# Patient Record
Sex: Male | Born: 1985
Health system: Southern US, Community
[De-identification: ages and names within clinical notes are randomized; demographics above are authoritative.]

## PROBLEM LIST (undated history)

## (undated) DIAGNOSIS — D869 Sarcoidosis, unspecified: Secondary | ICD-10-CM

## (undated) DIAGNOSIS — E119 Type 2 diabetes mellitus without complications: Secondary | ICD-10-CM

## (undated) DIAGNOSIS — G43909 Migraine, unspecified, not intractable, without status migrainosus: Secondary | ICD-10-CM

## (undated) DIAGNOSIS — I10 Essential (primary) hypertension: Secondary | ICD-10-CM

## (undated) DIAGNOSIS — J45909 Unspecified asthma, uncomplicated: Secondary | ICD-10-CM

## (undated) DIAGNOSIS — Z8619 Personal history of other infectious and parasitic diseases: Secondary | ICD-10-CM

## (undated) HISTORY — DX: Sarcoidosis, unspecified: D86.9

## (undated) HISTORY — DX: Migraine, unspecified, not intractable, without status migrainosus: G43.909

## (undated) HISTORY — DX: Personal history of other infectious and parasitic diseases: Z86.19

## (undated) HISTORY — DX: Essential (primary) hypertension: I10

## (undated) HISTORY — DX: Unspecified asthma, uncomplicated: J45.909

## (undated) HISTORY — PX: LEG SURGERY: SHX1003

---

## 2007-03-05 DIAGNOSIS — I10 Essential (primary) hypertension: Secondary | ICD-10-CM

## 2007-03-05 DIAGNOSIS — Y249XXA Unspecified firearm discharge, undetermined intent, initial encounter: Secondary | ICD-10-CM

## 2007-03-05 DIAGNOSIS — W3400XA Accidental discharge from unspecified firearms or gun, initial encounter: Secondary | ICD-10-CM

## 2007-03-05 HISTORY — DX: Essential (primary) hypertension: I10

## 2007-03-05 HISTORY — DX: Accidental discharge from unspecified firearms or gun, initial encounter: W34.00XA

## 2007-03-05 HISTORY — PX: OTHER SURGICAL HISTORY: SHX169

## 2007-03-05 HISTORY — DX: Unspecified firearm discharge, undetermined intent, initial encounter: Y24.9XXA

## 2010-04-19 ENCOUNTER — Emergency Department (HOSPITAL_COMMUNITY)
Admission: EM | Admit: 2010-04-19 | Discharge: 2010-04-19 | Disposition: A | Discharge status: Home / Self Care | Payer: Self-pay | Attending: Emergency Medicine | Admitting: Emergency Medicine

## 2010-04-19 DIAGNOSIS — R071 Chest pain on breathing: Secondary | ICD-10-CM | POA: Insufficient documentation

## 2010-05-10 ENCOUNTER — Emergency Department (HOSPITAL_COMMUNITY): Payer: Self-pay

## 2010-05-10 ENCOUNTER — Emergency Department (HOSPITAL_COMMUNITY)
Admission: EM | Admit: 2010-05-10 | Discharge: 2010-05-10 | Disposition: A | Discharge status: Home / Self Care | Payer: Self-pay | Attending: Emergency Medicine | Admitting: Emergency Medicine

## 2010-05-10 DIAGNOSIS — R071 Chest pain on breathing: Secondary | ICD-10-CM | POA: Insufficient documentation

## 2010-05-10 DIAGNOSIS — R110 Nausea: Secondary | ICD-10-CM | POA: Insufficient documentation

## 2010-05-10 DIAGNOSIS — R0989 Other specified symptoms and signs involving the circulatory and respiratory systems: Secondary | ICD-10-CM | POA: Insufficient documentation

## 2010-05-10 DIAGNOSIS — R61 Generalized hyperhidrosis: Secondary | ICD-10-CM | POA: Insufficient documentation

## 2010-05-10 DIAGNOSIS — R42 Dizziness and giddiness: Secondary | ICD-10-CM | POA: Insufficient documentation

## 2010-05-10 DIAGNOSIS — I509 Heart failure, unspecified: Secondary | ICD-10-CM | POA: Insufficient documentation

## 2010-05-10 DIAGNOSIS — R0609 Other forms of dyspnea: Secondary | ICD-10-CM | POA: Insufficient documentation

## 2010-05-10 DIAGNOSIS — R0602 Shortness of breath: Secondary | ICD-10-CM | POA: Insufficient documentation

## 2010-05-10 DIAGNOSIS — R55 Syncope and collapse: Secondary | ICD-10-CM | POA: Insufficient documentation

## 2010-05-10 DIAGNOSIS — R51 Headache: Secondary | ICD-10-CM | POA: Insufficient documentation

## 2010-05-10 DIAGNOSIS — I10 Essential (primary) hypertension: Secondary | ICD-10-CM | POA: Insufficient documentation

## 2010-05-10 LAB — RAPID URINE DRUG SCREEN, HOSP PERFORMED
Barbiturates: NOT DETECTED
Cocaine: NOT DETECTED
Opiates: NOT DETECTED
Tetrahydrocannabinol: NOT DETECTED

## 2010-05-10 LAB — DIFFERENTIAL
Eosinophils Relative: 4 % (ref 0–5)
Lymphocytes Relative: 40 % (ref 12–46)
Lymphs Abs: 2.9 10*3/uL (ref 0.7–4.0)
Monocytes Absolute: 0.7 10*3/uL (ref 0.1–1.0)

## 2010-05-10 LAB — BASIC METABOLIC PANEL
GFR calc non Af Amer: >60 mL/min (ref >60)
Glucose, Bld: 97 mg/dL (ref 70–99)
Potassium: 3.5 mEq/L (ref 3.5–5.1)
Sodium: 136 mEq/L (ref 135–145)

## 2010-05-10 LAB — CBC
HCT: 46.3 % (ref 39.0–52.0)
MCHC: 35.9 g/dL (ref 30.0–36.0)
MCV: 82.4 fL (ref 78.0–100.0)
RDW: 12.7 % (ref 11.5–15.5)
WBC: 7.2 10*3/uL (ref 4.0–10.5)

## 2010-05-10 LAB — BRAIN NATRIURETIC PEPTIDE: Pro B Natriuretic peptide (BNP): <30 pg/mL (ref 0.0–100.0)

## 2010-05-10 LAB — POCT CARDIAC MARKERS
CKMB, poc: <1 ng/mL — ABNORMAL LOW (ref 1.0–8.0)
Troponin i, poc: <0.05 ng/mL (ref 0.00–0.09)

## 2010-05-10 MED ORDER — IOHEXOL 300 MG/ML  SOLN
100.0000 mL | Freq: Once | INTRAMUSCULAR | Status: AC | PRN
  Administered 2010-05-10: 100 mL via INTRAVENOUS

## 2010-10-22 ENCOUNTER — Ambulatory Visit (INDEPENDENT_AMBULATORY_CARE_PROVIDER_SITE_OTHER): Payer: BC Managed Care – PPO | Admitting: Internal Medicine

## 2010-10-22 ENCOUNTER — Encounter: Payer: Self-pay | Admitting: *Deleted

## 2010-10-22 ENCOUNTER — Encounter: Payer: Self-pay | Admitting: Internal Medicine

## 2010-10-22 VITALS — BP 132/98 | HR 61 | Temp 98.4°F | Resp 14 | Ht 71.75 in | Wt 280.1 lb

## 2010-10-22 DIAGNOSIS — I10 Essential (primary) hypertension: Secondary | ICD-10-CM

## 2010-10-22 LAB — TSH: TSH: 1.79 u[IU]/mL (ref 0.35–5.50)

## 2010-10-22 LAB — BASIC METABOLIC PANEL
CO2: 30 mEq/L (ref 19–32)
GFR: 90.48 mL/min (ref >60.00)
Glucose, Bld: 122 mg/dL — ABNORMAL HIGH (ref 70–99)
Potassium: 4.9 mEq/L (ref 3.5–5.1)
Sodium: 140 mEq/L (ref 135–145)

## 2010-10-22 MED ORDER — METOPROLOL TARTRATE 25 MG PO TABS: 25.0000 mg | ORAL_TABLET | Freq: Two times a day (BID) | ORAL | Status: DC

## 2010-10-22 NOTE — Patient Instructions (Signed)
Check of blood pressure daily and particularly when you're not feeling well. Bring a log  when you come back Using a large cuff

## 2010-10-22 NOTE — Assessment & Plan Note (Addendum)
Patient reports elevated blood pressure for the last 3 years. Also having episodes of feeling slightly nauseous, dizzy, flushed and headaches. He went to the ER 5 months ago with chest pain shortness of breath, workup was negative, see history of present illness. BP today within normal, I also checked his BP in both arms and BPs are symmetric. Plan: Trial with low-dose of beta blocker since BP elevated in the ambulatory setting   recommend to continue with ambulatory blood pressure checks with a large cuff. If no better, consider rule out pheochromocytoma Repeat basic labs and a TSH.
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
## 2010-10-30 ENCOUNTER — Telehealth: Payer: Self-pay | Admitting: *Deleted

## 2010-10-30 NOTE — Telephone Encounter (Signed)
Patient came in to office. Informed Pt that OV would be necessary for assessment. Scheduled at 11:00am 10/31/10

## 2010-10-30 NOTE — Telephone Encounter (Signed)
Pt calling says that metoprolol helps w/ bp but did have to leave work today due to medication causing HA along w/ nausea makes him sick to his stomach. Tried to tough it out by symptoms aren't resolving and would like for medication to be change also will need to have work note since he left work this morning due to side effects.

## 2010-10-31 ENCOUNTER — Ambulatory Visit: Payer: Self-pay | Admitting: Internal Medicine

## 2010-10-31 ENCOUNTER — Encounter: Payer: Self-pay | Admitting: Internal Medicine

## 2010-10-31 ENCOUNTER — Ambulatory Visit (INDEPENDENT_AMBULATORY_CARE_PROVIDER_SITE_OTHER): Payer: BC Managed Care – PPO | Admitting: Internal Medicine

## 2010-10-31 ENCOUNTER — Encounter: Payer: Self-pay | Admitting: *Deleted

## 2010-10-31 VITALS — BP 108/72 | HR 62 | Temp 98.5°F | Resp 14 | Wt 282.2 lb

## 2010-10-31 DIAGNOSIS — I10 Essential (primary) hypertension: Secondary | ICD-10-CM

## 2010-10-31 MED ORDER — CARVEDILOL 3.125 MG PO TABS: 3.1250 mg | ORAL_TABLET | Freq: Two times a day (BID) | ORAL | Status: DC

## 2010-10-31 NOTE — Patient Instructions (Signed)
Check the  blood pressure daily , goal is between 110/60 to 140/85. If it is consistently higher or lower , let me know Start carvedilol twice day Take 2  Tablets twice a day if your BP not at goal Call with BP readings in 10 days, call if side effects
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
## 2010-10-31 NOTE — Assessment & Plan Note (Addendum)
Started metoprolol 25 mg bid last week, having  "side effects" -------> I wonder if nausea/dizziness are actually over controlled BP since his BP today is low  despite stopping BB 36 hours ago Plan: Trial w/ low dose coreg , titrate as needed, See instructions.

## 2010-11-01 ENCOUNTER — Ambulatory Visit: Payer: Self-pay | Admitting: Internal Medicine

## 2010-11-02 ENCOUNTER — Ambulatory Visit: Payer: Self-pay | Admitting: Internal Medicine

## 2010-12-03 ENCOUNTER — Ambulatory Visit: Payer: BC Managed Care – PPO | Admitting: Internal Medicine

## 2010-12-03 DIAGNOSIS — Z0289 Encounter for other administrative examinations: Secondary | ICD-10-CM

## 2012-01-17 ENCOUNTER — Encounter (HOSPITAL_COMMUNITY): Payer: Self-pay

## 2012-01-17 ENCOUNTER — Emergency Department (INDEPENDENT_AMBULATORY_CARE_PROVIDER_SITE_OTHER)
Admission: EM | Admit: 2012-01-17 | Discharge: 2012-01-17 | Disposition: A | Discharge status: Home / Self Care | Payer: Self-pay | Source: Home / Self Care | Attending: Emergency Medicine | Admitting: Emergency Medicine

## 2012-01-17 DIAGNOSIS — B958 Unspecified staphylococcus as the cause of diseases classified elsewhere: Secondary | ICD-10-CM

## 2012-01-17 DIAGNOSIS — R21 Rash and other nonspecific skin eruption: Secondary | ICD-10-CM

## 2012-01-17 DIAGNOSIS — L089 Local infection of the skin and subcutaneous tissue, unspecified: Secondary | ICD-10-CM

## 2012-01-17 MED ORDER — OMEPRAZOLE 20 MG PO CPDR: 20.0000 mg | DELAYED_RELEASE_CAPSULE | Freq: Every day | ORAL | Status: DC

## 2012-01-17 MED ORDER — DOXYCYCLINE HYCLATE 100 MG PO CAPS: 100.0000 mg | ORAL_CAPSULE | Freq: Two times a day (BID) | ORAL | Status: DC

## 2012-01-17 MED ORDER — ACETAMINOPHEN ER 650 MG PO TBCR: 650.0000 mg | EXTENDED_RELEASE_TABLET | Freq: Three times a day (TID) | ORAL | Status: DC | PRN

## 2012-01-17 MED ORDER — CHLORHEXIDINE GLUCONATE 4 % EX LIQD: 60.0000 mL | Freq: Every day | CUTANEOUS | Status: DC | PRN

## 2012-01-17 NOTE — ED Notes (Signed)
Patient c/o rash/insect bites ?? On torso and both legs, sx started 2 days ago, also complains of a cough that he has had for some time and states that it is worse after eating, has been feeling weak, fatigued and dizzy
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
## 2012-01-18 NOTE — ED Provider Notes (Signed)
Medical screening examination/treatment/procedure(s) were performed by non-physician practitioner and as supervising physician I was immediately available for consultation/collaboration.  Leslee Home, M.D.   Reuben Likes, MD 01/18/12 2214

## 2012-01-23 ENCOUNTER — Emergency Department (INDEPENDENT_AMBULATORY_CARE_PROVIDER_SITE_OTHER)
Admission: EM | Admit: 2012-01-23 | Discharge: 2012-01-23 | Disposition: A | Discharge status: Home / Self Care | Payer: Self-pay | Source: Home / Self Care | Attending: Emergency Medicine | Admitting: Emergency Medicine

## 2012-01-23 ENCOUNTER — Encounter (HOSPITAL_COMMUNITY): Payer: Self-pay | Admitting: Emergency Medicine

## 2012-01-23 DIAGNOSIS — L739 Follicular disorder, unspecified: Secondary | ICD-10-CM

## 2012-01-23 DIAGNOSIS — L738 Other specified follicular disorders: Secondary | ICD-10-CM

## 2012-01-23 MED ORDER — SULFAMETHOXAZOLE-TMP DS 800-160 MG PO TABS: 2.0000 | ORAL_TABLET | Freq: Two times a day (BID) | ORAL | Status: DC

## 2012-01-23 MED ORDER — MUPIROCIN 2 % EX OINT: TOPICAL_OINTMENT | Freq: Three times a day (TID) | CUTANEOUS | Status: DC

## 2012-01-23 NOTE — ED Notes (Signed)
Bumps/blister for one week.  The first location was the left side, then left thigh, then right thigh, now arms.  np on Friday told patient this was a skin infection and instructed to use hibiclens, doxycycline.  patientwas seen and started these meds last Friday 1/15.  Patient feels rash is getting worse, but particularly concerned about feeling drained, weak and tired.  Patient is not seeing any improvement
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
## 2012-01-24 NOTE — ED Notes (Signed)
Pt called stating that he wanted to see if his lab results were back.  Verified DOB and address.  Culture is still preliminary and I explained this to the pt.  Instructed the pt to call back on Monday and the final result should be back.  Then another voice from same line started talking and it was his mother.  Mother asked what meds he was on and what we diagnosed him with and wanted to know the preliminary results b/c "I am a nurse".  Explained to her that I couldn't tell her b/c she wasn't the pt.  Pt and mother on on same line and 2 different phones now and pt verbalized that it was ok to talk with her on the phone.  Told mother that I couldn't give them the preliminary results b/c if something changed and that we would give final result once they were back, also that he was placed Bactrim.  Mother wanted to know why we treated it with Bactrim if we didn't know if he had MRSA or not.  Explained to mother and pt that Dr. Lorenz Coaster treated him based on exam and that once the final culture results came back we would make sure that the infection was resistant to Bactrim and if not treat appropriately.  Mother then wanted to know where he got this from.  Explained to her that folliculitis is an infected hair follicle and that I can't determine where he got that from.  Going to call back later this weekend for results.

## 2012-01-25 ENCOUNTER — Telehealth (HOSPITAL_COMMUNITY): Payer: Self-pay | Admitting: Emergency Medicine

## 2012-01-25 NOTE — ED Notes (Signed)
I called patient back to verify that he was no longer taking doxycyline.  He stated he stopped it when he was here on 01/23/2012 per Dr Melanee Spry orders.

## 2012-01-25 NOTE — ED Notes (Signed)
Patient called requesting lab results.  I made patient aware that results were probably not final but would take a look.  I informed that the preliminary report showed MRSA but did not show sensitivity.  I explained culture process to patient to help him understand the length of time it took to give him complete and accurate results.  I told patient, just as he was informed yesterday, that our lab nurse would call him Monday with results and notify him if he was adequately treated.  I also explained to patient that if he was not we would call in an appropriate medication for him.  Patient concern about life threating complications from MRSA.  I explained to patient that MRSA can be life threatening for immune compromised patients but he did not meet that criteria.  I went over the importance of taking his antibiotics as instructed and finishing them completely unless we called and told him otherwise.  Patient expressed understanding and did not have any other questions or concerns.

## 2012-01-26 LAB — CULTURE, ROUTINE-ABSCESS: Special Requests: NORMAL

## 2012-01-27 ENCOUNTER — Telehealth (HOSPITAL_COMMUNITY): Payer: Self-pay | Admitting: *Deleted

## 2012-01-27 NOTE — ED Notes (Signed)
Pt. called in for his lab results.  Abscess culture R arm: Staph. Aureus.  Pt. treated with Doxycycline-resistant. He said he was given another Rx. But told not to start it until he got the results. It looks like pt. Got Bactrim DS on 11/21 and that is sensitive.   I told pt. To stop the Doxycycline and finish all of the Bactrim DS. Pt. voiced understanding. Vassie Moselle 01/27/2012

## 2012-02-05 NOTE — ED Notes (Signed)
Patient called asking for a note for his job to cover him being out of work from day of first visit (11-15) until return date (11-21) Discussed note duration with Dr Lorenz Coaster, who is authorizing note to cover period of 11/21-11/22 . Patient was advised this is the note offered for his absent days . We are unable to cover the other days , as this was a decision made by the patient w/o discussion w a representative of the [facility] clinic

## 2012-04-13 ENCOUNTER — Encounter (HOSPITAL_COMMUNITY): Payer: Self-pay | Admitting: Neurology

## 2012-04-13 ENCOUNTER — Emergency Department (HOSPITAL_COMMUNITY)
Admission: EM | Admit: 2012-04-13 | Discharge: 2012-04-13 | Disposition: A | Discharge status: Home / Self Care | Payer: Self-pay | Attending: Emergency Medicine | Admitting: Emergency Medicine

## 2012-04-13 ENCOUNTER — Emergency Department (HOSPITAL_COMMUNITY): Payer: Self-pay

## 2012-04-13 DIAGNOSIS — R053 Chronic cough: Secondary | ICD-10-CM

## 2012-04-13 DIAGNOSIS — R05 Cough: Secondary | ICD-10-CM

## 2012-04-13 DIAGNOSIS — I10 Essential (primary) hypertension: Secondary | ICD-10-CM | POA: Insufficient documentation

## 2012-04-13 DIAGNOSIS — R358 Other polyuria: Secondary | ICD-10-CM | POA: Insufficient documentation

## 2012-04-13 DIAGNOSIS — R3589 Other polyuria: Secondary | ICD-10-CM | POA: Insufficient documentation

## 2012-04-13 DIAGNOSIS — R0602 Shortness of breath: Secondary | ICD-10-CM | POA: Insufficient documentation

## 2012-04-13 DIAGNOSIS — R059 Cough, unspecified: Secondary | ICD-10-CM | POA: Insufficient documentation

## 2012-04-13 LAB — POCT I-STAT, CHEM 8
BUN: 15 mg/dL (ref 6–23)
Calcium, Ion: 1.2 mmol/L (ref 1.12–1.23)
Chloride: 102 mEq/L (ref 96–112)
Creatinine, Ser: 1.1 mg/dL (ref 0.50–1.35)
Glucose, Bld: 229 mg/dL — ABNORMAL HIGH (ref 70–99)

## 2012-04-13 NOTE — ED Notes (Addendum)
Pt states sob all day x 5 years worse at night and after eating. States coughing all day without relief. Recently moved to area no PCP established. States polyuria, lips and mouth constantly dry. Family hx of CHF. Respiration even, unlabored. A x 4, NAD
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
## 2012-05-19 ENCOUNTER — Emergency Department (HOSPITAL_COMMUNITY): Payer: Self-pay

## 2012-05-19 ENCOUNTER — Encounter (HOSPITAL_COMMUNITY): Payer: Self-pay | Admitting: Cardiology

## 2012-05-19 ENCOUNTER — Emergency Department (HOSPITAL_COMMUNITY)
Admission: EM | Admit: 2012-05-19 | Discharge: 2012-05-20 | Disposition: A | Discharge status: Home / Self Care | Payer: Self-pay | Attending: Emergency Medicine | Admitting: Emergency Medicine

## 2012-05-19 DIAGNOSIS — R110 Nausea: Secondary | ICD-10-CM | POA: Insufficient documentation

## 2012-05-19 DIAGNOSIS — I10 Essential (primary) hypertension: Secondary | ICD-10-CM | POA: Insufficient documentation

## 2012-05-19 DIAGNOSIS — R109 Unspecified abdominal pain: Secondary | ICD-10-CM | POA: Insufficient documentation

## 2012-05-19 DIAGNOSIS — E119 Type 2 diabetes mellitus without complications: Secondary | ICD-10-CM

## 2012-05-19 DIAGNOSIS — R350 Frequency of micturition: Secondary | ICD-10-CM | POA: Insufficient documentation

## 2012-05-19 DIAGNOSIS — R300 Dysuria: Secondary | ICD-10-CM | POA: Insufficient documentation

## 2012-05-19 DIAGNOSIS — E1169 Type 2 diabetes mellitus with other specified complication: Secondary | ICD-10-CM | POA: Insufficient documentation

## 2012-05-19 DIAGNOSIS — R739 Hyperglycemia, unspecified: Secondary | ICD-10-CM

## 2012-05-19 LAB — GLUCOSE, CAPILLARY: Glucose-Capillary: 467 mg/dL — ABNORMAL HIGH (ref 70–99)

## 2012-05-19 LAB — CBC WITH DIFFERENTIAL/PLATELET
Basophils Absolute: 0 10*3/uL (ref 0.0–0.1)
Eosinophils Relative: 3 % (ref 0–5)
HCT: 43.1 % (ref 39.0–52.0)
Hemoglobin: 15.8 g/dL (ref 13.0–17.0)
Lymphocytes Relative: 36 % (ref 12–46)
MCHC: 36.7 g/dL — ABNORMAL HIGH (ref 30.0–36.0)
MCV: 78.4 fL (ref 78.0–100.0)
Monocytes Absolute: 0.5 10*3/uL (ref 0.1–1.0)
Monocytes Relative: 6 % (ref 3–12)
Neutro Abs: 4.2 10*3/uL (ref 1.7–7.7)
RDW: 12.3 % (ref 11.5–15.5)
WBC: 7.6 10*3/uL (ref 4.0–10.5)

## 2012-05-19 LAB — URINALYSIS, ROUTINE W REFLEX MICROSCOPIC
Leukocytes, UA: NEGATIVE
Protein, ur: NEGATIVE mg/dL
Urobilinogen, UA: 0.2 mg/dL (ref 0.0–1.0)

## 2012-05-19 LAB — BASIC METABOLIC PANEL
BUN: 12 mg/dL (ref 6–23)
CO2: 26 mEq/L (ref 19–32)
Calcium: 9.4 mg/dL (ref 8.4–10.5)
Chloride: 91 mEq/L — ABNORMAL LOW (ref 96–112)
Creatinine, Ser: 1.26 mg/dL (ref 0.50–1.35)

## 2012-05-19 MED ORDER — INSULIN REGULAR HUMAN 100 UNIT/ML IJ SOLN: 6.0000 [IU] | Freq: Once | INTRAMUSCULAR | Status: DC

## 2012-05-19 MED ORDER — SODIUM CHLORIDE 0.9 % IV BOLUS (SEPSIS)
1000.0000 mL | Freq: Once | INTRAVENOUS | Status: AC
  Administered 2012-05-19: 1000 mL via INTRAVENOUS

## 2012-05-19 MED ORDER — INSULIN ASPART 100 UNIT/ML ~~LOC~~ SOLN
6.0000 [IU] | Freq: Once | SUBCUTANEOUS | Status: AC
  Administered 2012-05-20: 6 [IU] via INTRAVENOUS
  Filled 2012-05-19: qty 1

## 2012-05-19 MED ORDER — METFORMIN HCL 500 MG PO TABS
500.0000 mg | ORAL_TABLET | Freq: Once | ORAL | Status: AC
  Administered 2012-05-20: 500 mg via ORAL
  Filled 2012-05-19: qty 1

## 2012-05-19 MED ORDER — SODIUM CHLORIDE 0.9 % IV BOLUS (SEPSIS)
1000.0000 mL | Freq: Once | INTRAVENOUS | Status: AC
  Administered 2012-05-20: 1000 mL via INTRAVENOUS

## 2012-05-19 MED ORDER — INSULIN ASPART 100 UNIT/ML ~~LOC~~ SOLN
6.0000 [IU] | Freq: Once | SUBCUTANEOUS | Status: AC
  Administered 2012-05-19: 6 [IU] via INTRAVENOUS

## 2012-05-19 NOTE — ED Notes (Signed)
Second bolus of IVFs running.
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
## 2012-05-19 NOTE — ED Notes (Addendum)
Pt reports pain to the left foot. States he dropped a cinder block on his foot a couple of days. States he also has been urinating a lot and increased thirst. Also reports intermittent nausea at times. Also c/o bilateral flank pain.

## 2012-05-19 NOTE — ED Notes (Signed)
Xray contacted to bring pt to pod a room 1 on return to ED

## 2012-05-20 LAB — GLUCOSE, CAPILLARY
Glucose-Capillary: 189 mg/dL — ABNORMAL HIGH (ref 70–99)
Glucose-Capillary: 317 mg/dL — ABNORMAL HIGH (ref 70–99)

## 2012-05-20 MED ORDER — METFORMIN HCL 500 MG PO TABS: 500.0000 mg | ORAL_TABLET | Freq: Two times a day (BID) | ORAL | Status: DC

## 2012-05-20 MED ORDER — INSULIN ASPART 100 UNIT/ML ~~LOC~~ SOLN
12.0000 [IU] | Freq: Once | SUBCUTANEOUS | Status: AC
  Administered 2012-05-20: 12 [IU] via INTRAVENOUS

## 2012-05-20 NOTE — ED Notes (Signed)
Pt given discharge paperwork; pt verbalized understanding of f/u and d/c; all questions answered that pt had; pt education given regarding pt's diagnosis of diabetes; VSS; resps e/u; e-signature obtained; ambulatory on discharge;

## 2012-05-20 NOTE — ED Provider Notes (Signed)
Medical screening examination/treatment/procedure(s) were conducted as a shared visit with non-physician practitioner(s) and myself.  I personally evaluated the patient during the encounter. Patient with new-onset diabetes. No infection. Patient was started on metformin after lowering his glucose here. He'll followup with primary care Dr. He states he will see Dr. Clemmie Krill R. Rubin Payor, MD 05/20/12 [phone]

## 2012-05-20 NOTE — ED Provider Notes (Signed)
Care assumed from prior team.  Pt with newly diagnosed diabetes, was receiving iv fluids and insulin.  Has PCM.  To be d/c on metformin.  BS now below 200.  Will d/c home  Olivia Mackie, MD 05/20/12 [phone]

## 2012-12-13 ENCOUNTER — Emergency Department (HOSPITAL_COMMUNITY)
Admission: EM | Admit: 2012-12-13 | Discharge: 2012-12-13 | Disposition: A | Discharge status: Home / Self Care | Payer: No Typology Code available for payment source | Attending: Emergency Medicine | Admitting: Emergency Medicine

## 2012-12-13 ENCOUNTER — Emergency Department (HOSPITAL_COMMUNITY): Payer: No Typology Code available for payment source

## 2012-12-13 ENCOUNTER — Encounter (HOSPITAL_COMMUNITY): Payer: Self-pay | Admitting: Emergency Medicine

## 2012-12-13 DIAGNOSIS — Y9389 Activity, other specified: Secondary | ICD-10-CM | POA: Insufficient documentation

## 2012-12-13 DIAGNOSIS — E119 Type 2 diabetes mellitus without complications: Secondary | ICD-10-CM | POA: Insufficient documentation

## 2012-12-13 DIAGNOSIS — Z79899 Other long term (current) drug therapy: Secondary | ICD-10-CM | POA: Insufficient documentation

## 2012-12-13 DIAGNOSIS — I10 Essential (primary) hypertension: Secondary | ICD-10-CM | POA: Insufficient documentation

## 2012-12-13 DIAGNOSIS — S8010XA Contusion of unspecified lower leg, initial encounter: Secondary | ICD-10-CM | POA: Insufficient documentation

## 2012-12-13 DIAGNOSIS — T148XXA Other injury of unspecified body region, initial encounter: Secondary | ICD-10-CM

## 2012-12-13 DIAGNOSIS — Y92410 Unspecified street and highway as the place of occurrence of the external cause: Secondary | ICD-10-CM | POA: Insufficient documentation

## 2012-12-13 HISTORY — DX: Type 2 diabetes mellitus without complications: E11.9

## 2012-12-13 LAB — CBC WITH DIFFERENTIAL/PLATELET
Basophils Relative: 0 % (ref 0–1)
Hemoglobin: 14.7 g/dL (ref 13.0–17.0)
Lymphs Abs: 3 10*3/uL (ref 0.7–4.0)
MCHC: 35.3 g/dL (ref 30.0–36.0)
Monocytes Relative: 7 % (ref 3–12)
Neutro Abs: 3.6 10*3/uL (ref 1.7–7.7)
Neutrophils Relative %: 49 % (ref 43–77)
Platelets: 375 10*3/uL (ref 150–400)
RBC: 4.99 MIL/uL (ref 4.22–5.81)

## 2012-12-13 LAB — BASIC METABOLIC PANEL
BUN: 20 mg/dL (ref 6–23)
Chloride: 103 mEq/L (ref 96–112)
GFR calc Af Amer: >90 mL/min (ref >90)
Potassium: 4.2 mEq/L (ref 3.5–5.1)
Sodium: 139 mEq/L (ref 135–145)

## 2012-12-13 MED ORDER — FENTANYL CITRATE 0.05 MG/ML IJ SOLN
50.0000 ug | Freq: Once | INTRAMUSCULAR | Status: AC
  Administered 2012-12-13: 50 ug via INTRAVENOUS
  Filled 2012-12-13: qty 2

## 2012-12-13 MED ORDER — IBUPROFEN 600 MG PO TABS: 600.0000 mg | ORAL_TABLET | Freq: Four times a day (QID) | ORAL | Status: DC | PRN

## 2012-12-13 MED ORDER — HYDROCODONE-ACETAMINOPHEN 5-325 MG PO TABS: 1.0000 | ORAL_TABLET | Freq: Four times a day (QID) | ORAL | Status: DC | PRN

## 2012-12-13 MED ORDER — SODIUM CHLORIDE 0.9 % IV SOLN
Freq: Once | INTRAVENOUS | Status: AC
  Administered 2012-12-13: 04:00:00 via INTRAVENOUS

## 2012-12-13 MED ORDER — KETOROLAC TROMETHAMINE 30 MG/ML IJ SOLN
30.0000 mg | Freq: Once | INTRAMUSCULAR | Status: AC
  Administered 2012-12-13: 30 mg via INTRAVENOUS
  Filled 2012-12-13: qty 1

## 2012-12-13 MED ORDER — PROMETHAZINE HCL 25 MG PO TABS: 25.0000 mg | ORAL_TABLET | Freq: Four times a day (QID) | ORAL | Status: DC | PRN

## 2012-12-13 MED ORDER — ONDANSETRON HCL 4 MG/2ML IJ SOLN
4.0000 mg | Freq: Once | INTRAMUSCULAR | Status: AC
  Administered 2012-12-13: 4 mg via INTRAVENOUS
  Filled 2012-12-13: qty 2
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
## 2012-12-13 NOTE — Progress Notes (Signed)
Orthopedic Tech Progress Note Patient Details:  Nicholas [name] 1985/06/17 [id number]  Ortho Devices Type of Ortho Device: Knee Immobilizer;Crutches   Haskell Flirt 12/13/2012, 6:56 AM

## 2012-12-13 NOTE — ED Notes (Signed)
Pt given d/c instructions and verbalized understanding. NAD at this time. VS are stable.  
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
## 2013-11-29 ENCOUNTER — Encounter (HOSPITAL_COMMUNITY): Payer: Self-pay | Admitting: Emergency Medicine

## 2013-11-29 ENCOUNTER — Emergency Department (INDEPENDENT_AMBULATORY_CARE_PROVIDER_SITE_OTHER)
Admission: EM | Admit: 2013-11-29 | Discharge: 2013-11-29 | Disposition: A | Discharge status: Nursing Home (Includes ICF) | Payer: Self-pay | Source: Home / Self Care

## 2013-11-29 DIAGNOSIS — E119 Type 2 diabetes mellitus without complications: Secondary | ICD-10-CM

## 2013-11-29 DIAGNOSIS — R5381 Other malaise: Secondary | ICD-10-CM

## 2013-11-29 DIAGNOSIS — E1165 Type 2 diabetes mellitus with hyperglycemia: Secondary | ICD-10-CM

## 2013-11-29 DIAGNOSIS — R7309 Other abnormal glucose: Secondary | ICD-10-CM

## 2013-11-29 DIAGNOSIS — I10 Essential (primary) hypertension: Secondary | ICD-10-CM | POA: Insufficient documentation

## 2013-11-29 DIAGNOSIS — E860 Dehydration: Secondary | ICD-10-CM | POA: Insufficient documentation

## 2013-11-29 DIAGNOSIS — R5383 Other fatigue: Secondary | ICD-10-CM

## 2013-11-29 DIAGNOSIS — R739 Hyperglycemia, unspecified: Secondary | ICD-10-CM

## 2013-11-29 LAB — COMPREHENSIVE METABOLIC PANEL
ALBUMIN: 4.1 g/dL (ref 3.5–5.2)
ALK PHOS: 74 U/L (ref 39–117)
ALT: 20 U/L (ref 0–53)
ANION GAP: 16 — AB (ref 5–15)
AST: 24 U/L (ref 0–37)
BUN: 16 mg/dL (ref 6–23)
CO2: 23 mEq/L (ref 19–32)
CREATININE: 1.01 mg/dL (ref 0.50–1.35)
Calcium: 9.6 mg/dL (ref 8.4–10.5)
Chloride: 96 mEq/L (ref 96–112)
GFR calc Af Amer: >90 mL/min (ref >90)
GFR calc non Af Amer: >90 mL/min (ref >90)
Glucose, Bld: 316 mg/dL — ABNORMAL HIGH (ref 70–99)
POTASSIUM: 4.4 meq/L (ref 3.7–5.3)
Sodium: 135 mEq/L — ABNORMAL LOW (ref 137–147)
Total Bilirubin: 0.3 mg/dL (ref 0.3–1.2)
Total Protein: 7.9 g/dL (ref 6.0–8.3)

## 2013-11-29 LAB — CBC WITH DIFFERENTIAL/PLATELET
BASOS PCT: 0 % (ref 0–1)
Basophils Absolute: 0 10*3/uL (ref 0.0–0.1)
EOS ABS: 0.1 10*3/uL (ref 0.0–0.7)
Eosinophils Relative: 1 % (ref 0–5)
HCT: 46.4 % (ref 39.0–52.0)
HEMOGLOBIN: 16.3 g/dL (ref 13.0–17.0)
Lymphocytes Relative: 44 % (ref 12–46)
Lymphs Abs: 3.4 10*3/uL (ref 0.7–4.0)
MCH: 28.3 pg (ref 26.0–34.0)
MCHC: 35.1 g/dL (ref 30.0–36.0)
MCV: 80.6 fL (ref 78.0–100.0)
Monocytes Absolute: 0.4 10*3/uL (ref 0.1–1.0)
Monocytes Relative: 5 % (ref 3–12)
NEUTROS PCT: 50 % (ref 43–77)
Neutro Abs: 3.9 10*3/uL (ref 1.7–7.7)
Platelets: 392 10*3/uL (ref 150–400)
RBC: 5.76 MIL/uL (ref 4.22–5.81)
RDW: 12.5 % (ref 11.5–15.5)
WBC: 7.8 10*3/uL (ref 4.0–10.5)

## 2013-11-29 LAB — POCT I-STAT, CHEM 8
BUN: 17 mg/dL (ref 6–23)
CALCIUM ION: 1.19 mmol/L (ref 1.12–1.23)
Chloride: 100 mEq/L (ref 96–112)
Creatinine, Ser: 1.1 mg/dL (ref 0.50–1.35)
Glucose, Bld: 344 mg/dL — ABNORMAL HIGH (ref 70–99)
HEMATOCRIT: 54 % — AB (ref 39.0–52.0)
Hemoglobin: 18.4 g/dL — ABNORMAL HIGH (ref 13.0–17.0)
Potassium: 4 mEq/L (ref 3.7–5.3)
Sodium: 135 mEq/L — ABNORMAL LOW (ref 137–147)
TCO2: 25 mmol/L (ref 0–100)

## 2013-11-29 LAB — CBG MONITORING, ED: GLUCOSE-CAPILLARY: 303 mg/dL — AB (ref 70–99)

## 2013-11-29 NOTE — Discharge Instructions (Signed)
Hyperglycemia Go to ED [facility] now Hyperglycemia occurs when the glucose (sugar) in your blood is too high. Hyperglycemia can happen for many reasons, but it most often happens to people who do not know they have diabetes or are not managing their diabetes properly.  CAUSES  Whether you have diabetes or not, there are other causes of hyperglycemia. Hyperglycemia can occur when you have diabetes, but it can also occur in other situations that you might not be as aware of, such as: Diabetes  If you have diabetes and are having problems controlling your blood glucose, hyperglycemia could occur because of some of the following reasons:  Not following your meal plan.  Not taking your diabetes medications or not taking it properly.  Exercising less or doing less activity than you normally do.  Being sick. Pre-diabetes  This cannot be ignored. Before people develop Type 2 diabetes, they almost always have "pre-diabetes." This is when your blood glucose levels are higher than normal, but not yet high enough to be diagnosed as diabetes. Research has shown that some long-term damage to the body, especially the heart and circulatory system, may already be occurring during pre-diabetes. If you take action to manage your blood glucose when you have pre-diabetes, you may delay or prevent Type 2 diabetes from developing. Stress  If you have diabetes, you may be "diet" controlled or on oral medications or insulin to control your diabetes. However, you may find that your blood glucose is higher than usual in the hospital whether you have diabetes or not. This is often referred to as "stress hyperglycemia." Stress can elevate your blood glucose. This happens because of hormones put out by the body during times of stress. If stress has been the cause of your high blood glucose, it can be followed regularly by your caregiver. That way he/she can make sure your hyperglycemia does not continue to get worse or progress  to diabetes. Steroids  Steroids are medications that act on the infection fighting system (immune system) to block inflammation or infection. One side effect can be a rise in blood glucose. Most people can produce enough extra insulin to allow for this rise, but for those who cannot, steroids make blood glucose levels go even higher. It is not unusual for steroid treatments to "uncover" diabetes that is developing. It is not always possible to determine if the hyperglycemia will go away after the steroids are stopped. A special blood test called an A1c is sometimes done to determine if your blood glucose was elevated before the steroids were started. SYMPTOMS  Thirsty.  Frequent urination.  Dry mouth.  Blurred vision.  Tired or fatigue.  Weakness.  Sleepy.  Tingling in feet or leg. DIAGNOSIS  Diagnosis is made by monitoring blood glucose in one or all of the following ways:  A1c test. This is a chemical found in your blood.  Fingerstick blood glucose monitoring.  Laboratory results. TREATMENT  First, knowing the cause of the hyperglycemia is important before the hyperglycemia can be treated. Treatment may include, but is not be limited to:  Education.  Change or adjustment in medications.  Change or adjustment in meal plan.  Treatment for an illness, infection, etc.  More frequent blood glucose monitoring.  Change in exercise plan.  Decreasing or stopping steroids.  Lifestyle changes. HOME CARE INSTRUCTIONS   Test your blood glucose as directed.  Exercise regularly. Your caregiver will give you instructions about exercise. Pre-diabetes or diabetes which comes on with stress is helped  by exercising.  Eat wholesome, balanced meals. Eat often and at regular, fixed times. Your caregiver or nutritionist will give you a meal plan to guide your sugar intake.  Being at an ideal weight is important. If needed, losing as little as 10 to 15 pounds may help improve blood  glucose levels. SEEK MEDICAL CARE IF:   You have questions about medicine, activity, or diet.  You continue to have symptoms (problems such as increased thirst, urination, or weight gain). SEEK IMMEDIATE MEDICAL CARE IF:   You are vomiting or have diarrhea.  Your breath smells fruity.  You are breathing faster or slower.  You are very sleepy or incoherent.  You have numbness, tingling, or pain in your feet or hands.  You have chest pain.  Your symptoms get worse even though you have been following your caregiver's orders.  If you have any other questions or concerns. Document Released: 08/14/2000 Document Revised: 05/13/2011 Document Reviewed: 06/17/2011 [employer] Patient Information 2015 [place], [place]. This information is not intended to replace advice given to you by your health care provider. Make sure you discuss any questions you have with your health care provider.

## 2013-11-29 NOTE — ED Notes (Signed)
Pt was seen at [facility] for evaluation of hyperglycemia- pt reports taking his blood sugar at home and it was 633 today- pt went to [facility] for evaluation and was instructed to come to ED.  Pt reports feeling tired and having headaches for the past 3 months.  Pt reports he stopped taking Metformin 3 months ago because of the side effects- pt attempted to control diabetes with diet and exercise.

## 2013-11-29 NOTE — ED Notes (Signed)
Janne Napoleon NP with patient .
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
## 2013-11-30 ENCOUNTER — Emergency Department (HOSPITAL_COMMUNITY)
Admission: EM | Admit: 2013-11-30 | Discharge: 2013-11-30 | Disposition: A | Discharge status: Home / Self Care | Payer: Self-pay | Attending: Emergency Medicine | Admitting: Emergency Medicine

## 2013-11-30 DIAGNOSIS — E860 Dehydration: Secondary | ICD-10-CM

## 2013-11-30 DIAGNOSIS — E1165 Type 2 diabetes mellitus with hyperglycemia: Secondary | ICD-10-CM

## 2013-11-30 LAB — URINALYSIS, ROUTINE W REFLEX MICROSCOPIC
Bilirubin Urine: NEGATIVE
Glucose, UA: >1000 mg/dL — AB
Hgb urine dipstick: NEGATIVE
Ketones, ur: 40 mg/dL — AB
LEUKOCYTES UA: NEGATIVE
NITRITE: NEGATIVE
PH: 5 (ref 5.0–8.0)
Protein, ur: NEGATIVE mg/dL
Specific Gravity, Urine: 1.025 (ref 1.005–1.030)
UROBILINOGEN UA: 0.2 mg/dL (ref 0.0–1.0)

## 2013-11-30 LAB — URINE MICROSCOPIC-ADD ON

## 2013-11-30 LAB — CBG MONITORING, ED
Glucose-Capillary: 254 mg/dL — ABNORMAL HIGH (ref 70–99)
Glucose-Capillary: 195 mg/dL — ABNORMAL HIGH (ref 70–99)

## 2013-11-30 MED ORDER — GLIPIZIDE 5 MG PO TABS: 5.0000 mg | ORAL_TABLET | Freq: Every day | ORAL | Status: AC

## 2013-11-30 MED ORDER — SODIUM CHLORIDE 0.9 % IV BOLUS (SEPSIS)
1000.0000 mL | Freq: Once | INTRAVENOUS | Status: AC
  Administered 2013-11-30: 1000 mL via INTRAVENOUS

## 2013-11-30 MED ORDER — INSULIN ASPART 100 UNIT/ML ~~LOC~~ SOLN
10.0000 [IU] | Freq: Once | SUBCUTANEOUS | Status: AC
  Administered 2013-11-30: 10 [IU] via INTRAVENOUS
  Filled 2013-11-30: qty 1

## 2013-11-30 NOTE — Discharge Instructions (Signed)
Diabetes and Exercise Diabetes mellitus is a common, chronic disease, in which the pancreas is unable to adequately control blood glucose (sugar) levels. There are 2 types of diabetes. Type 1 diabetes patients are unable to produce insulin, a hormone that causes sugar in the blood to be stored in the body. People with type 1 diabetes may compensate by giving themselves injections of insulin. Type 2 diabetes involves not producing adequate amounts of insulin to control blood glucose levels. People with type 2 diabetes control their blood glucose by monitoring their food intake or by taking medicine. Exercise is an important part of diabetes treatment. During exercise, the muscles use a greater amount of glucose from the blood for energy. This lowers your blood glucose, which is the same effect you would get from taking insulin. It has been shown that endurance athletes are more sensitive to insulin than inactive people. SYMPTOMS  Many people with a mild case of diabetes have no symptoms. However, if left uncontrolled, diabetes can lead to several complications that could be prevented with treatment of the disease. General symptoms of diabetes include:   Frequent urination (polyuria).  Frequent thirst and drinking (polydipsia).  Increased food consumption (polyphagia).  Fatigue.  Poor exercise performance.  Blurred vision.  Inflammation of the vagina (vaginitis) caused by fungal infections.  Skin infections (uncommon).  Numbness in the feet, caused by nerve injury.  Kidney disease. CAUSES  The cause of most cases of diabetes is unknown. In children, diabetes is often due to an autoimmune response to the cells in the pancreas that make insulin. It is also linked with other diseases, such as cystic fibrosis. Diabetes may have a genetic link. PREVENTION  Athletes should strive to begin exercise with blood glucose in a well-controlled state.  Feet should always be kept clean and  dry.  Activities in which low blood sugar levels cannot be treated easily (scuba diving, rock climbing, swimming) should be avoided.  Anticipate alterations in diet or training to avoid low blood sugar (hypoglycemia) and high blood sugar (hyperglycemia).  Athletes should try to increase sugar consumption after strenuous exercise to avoid hypoglycemia.  Short-acting insulin should not be injected into an actively exercising muscle. The athlete should rest the injection site for about 1 hour after exercise.  Patients with diabetes should get routine checkups of the feet to prevent complications. PROGNOSIS  Exercise provides many benefits to the person with diabetes:   Reduced body fat.  Lower blood pressure.  Often, reduced need for medicines.  Improved exercise tolerance.  Lower insulin levels.  Weight loss.  Improved lipid profile (decreased cholesterol and low-density lipoproteins). RELATED COMPLICATIONS  If performed incorrectly, exercise can result in complications of diabetes:   Poor control of blood sugar, when exercise is performed at the wrong time.  Increase in renal disease, from loss of body fluids (dehydration).  Increased risk of nerve injury (neuropathy) when performing exercises that increase foot injury.  Increased risk of eye problems when performing activities that involve breath holding or lowering or jarring the head.  Increased risk of sudden death from exercise in patients with heart disease.  Worsening of hypertension with heavy lifting (more than 10 lb/4.5 kg). Altered blood glucose and insulin dose as a result of mild illness that produces loss of appetite.  Altered uptake of insulin after injection when insulin injection site is changed. NOTE: Exercise can lower blood glucose effectively, but the effects are short-lasting (no more than a couple of days). Exercise has been shown to  improve your sensitivity to insulin. This may alter how your body  responds to a given dose of injected insulin. It is important for every patient with diabetes to know how his or her body may react to exercise, and to adjust insulin dosages accordingly. TREATMENT  Eat about 1 to 3 hours before exercise.  Check blood glucose immediately before and after exercise.  Stop exercise if blood glucose is more than 250 mg/dL.  Stop exercise if blood glucose is less than 100 mg/dL.  Do not exercise within 1 hour of an insulin injection.  Be prepared to treat low blood glucose while exercising. Keep some sugar product with you, such as a candy bar.  For prolonged exercise, use a sports drink to maintain your glucose level.  Replace used-up glucose in the body after exercise.  Consume fluids during and after exercise to avoid dehydration. SEEK MEDICAL CARE IF:  You have vision changes after a run.  You notice a loss of sensation in your feet after exercise.  You have increased numbness, tingling, or pins and needles sensations after exercise.  You have chest pain during or after exercise.  You have a fast, irregular heartbeat (palpitations) during or after exercise.  Your exercise tolerance gets worse.  You have fainting or dizzy spells for brief periods during or after exercise. Document Released: 02/18/2005 Document Revised: 07/05/2013 Document Reviewed: 06/02/2008 [facility] Patient Information 2015 [place], [place]. This information is not intended to replace advice given to you by your health care provider. Make sure you discuss any questions you have with your health care provider.  Complementary and Alternative Medical Therapies for Diabetes Complementary and alternative medicines are health care practices or products that are not always accepted as part of routine medicine. Complementary medicine is used along with routine medicine (medical therapy). Alternative medicine can sometimes be used instead of routine medicine. Some people use these  methods to treat diabetes. While some of these therapies may be effective, others may not be. Some may even be harmful. Patients using these methods need to tell their caregiver. It is important to let your caregivers know what you are doing. Some of these therapies are discussed below. For more information, talk with your caregiver. THERAPIES Acupuncture Acupuncture is done by a professional who inserts needles into certain points on the skin. Some scientists believe that this triggers the release of the body's natural painkillers. It has been shown to relieve long-term (chronic) pain. This may help patients with painful nerve damage caused by diabetes. Biofeedback Biofeedback helps a person become more aware of the body's response to pain. It also helps you learn to deal with the pain. This alternative therapy focuses on relaxation and stress-reduction techniques. Thinking of peaceful mental images (guided imagery) is one technique. Some people believe these images can ease their condition. MEDICATIONS Chromium Several studies report that chromium supplements may improve diabetes control. Chromium helps insulin improve its action. Research is not yet certain. Supplements have not been recommended or approved. Caution is needed if you have kidney (renal) problems. Ginseng There are several types of ginseng plants. American ginseng is used for diabetes studies. Those studies have shown some glucose-lowering effects. Those effects have been seen with fasting and after-meal blood glucose levels. They have also been seen in A1c levels (average blood glucose levels over a 86-month period). More long-term studies are needed before recommendations for use of ginseng can be made. Magnesium Experts have studied the relationship between magnesium and diabetes for many years. But  it is not yet fully understood. Studies suggest that a low amount of magnesium may make blood glucose control worse in type 2 diabetes.  Research also shows that a low amount may contribute to certain diabetes complications. One study showed that people who consume more magnesium had less risk of type 2 diabetes. Eating whole grains, nuts, and green leafy vegetables raises the magnesium level. Vanadium Vanadium is a compound found in tiny amounts in plants and animals. Early studies showed that vanadium improved blood glucose levels in animals with type 1 and type 2 diabetes. One study found that when given vanadium, those with diabetes were able to decrease their insulin dosage. Researchers still need to learn how it works in the body to discover any side effects, and to find safe dosages. Cinnamon There have been a couple of studies that seem to indicate cinnamon decreases insulin resistance and increases insulin production. By doing so, it may lower blood glucose. Exact doses are unknown, but it may work best when used in combination with other diabetes medicines. Document Released: 12/16/2006 Document Revised: 05/13/2011 Document Reviewed: 12/29/2008 [facility] Patient Information 2015 [place], [place]. This information is not intended to replace advice given to you by your health care provider. Make sure you discuss any questions you have with your health care provider.  Diabetes and Exercise Exercising regularly is important. It is not just about losing weight. It has many health benefits, such as:  Improving your overall fitness, flexibility, and endurance.  Increasing your bone density.  Helping with weight control.  Decreasing your body fat.  Increasing your muscle strength.  Reducing stress and tension.  Improving your overall health. People with diabetes who exercise gain additional benefits because exercise:  Reduces appetite.  Improves the body's use of blood sugar (glucose).  Helps lower or control blood glucose.  Decreases blood pressure.  Helps control blood lipids (such as cholesterol and  triglycerides).  Improves the body's use of the hormone insulin by:  Increasing the body's insulin sensitivity.  Reducing the body's insulin needs.  Decreases the risk for heart disease because exercising:  Lowers cholesterol and triglycerides levels.  Increases the levels of good cholesterol (such as high-density lipoproteins [HDL]) in the body.  Lowers blood glucose levels. YOUR ACTIVITY PLAN  Choose an activity that you enjoy and set realistic goals. Your health care provider or diabetes educator can help you make an activity plan that works for you. Exercise regularly as directed by your health care provider. This includes:  Performing resistance training twice a week such as push-ups, sit-ups, lifting weights, or using resistance bands.  Performing 150 minutes of cardio exercises each week such as walking, running, or playing sports.  Staying active and spending no more than 90 minutes at one time being inactive. Even short bursts of exercise are good for you. Three 10-minute sessions spread throughout the day are just as beneficial as a single 30-minute session. Some exercise ideas include:  Taking the dog for a walk.  Taking the stairs instead of the elevator.  Dancing to your favorite song.  Doing an exercise video.  Doing your favorite exercise with a friend. RECOMMENDATIONS FOR EXERCISING WITH TYPE 1 OR TYPE 2 DIABETES   Check your blood glucose before exercising. If blood glucose levels are greater than 240 mg/dL, check for urine ketones. Do not exercise if ketones are present.  Avoid injecting insulin into areas of the body that are going to be exercised. For example, avoid injecting insulin into:  The  arms when playing tennis.  The legs when jogging.  Keep a record of:  Food intake before and after you exercise.  Expected peak times of insulin action.  Blood glucose levels before and after you exercise.  The type and amount of exercise you have  done.  Review your records with your health care provider. Your health care provider will help you to develop guidelines for adjusting food intake and insulin amounts before and after exercising.  If you take insulin or oral hypoglycemic agents, watch for signs and symptoms of hypoglycemia. They include:  Dizziness.  Shaking.  Sweating.  Chills.  Confusion.  Drink plenty of water while you exercise to prevent dehydration or heat stroke. Body water is lost during exercise and must be replaced.  Talk to your health care provider before starting an exercise program to make sure it is safe for you. Remember, almost any type of activity is better than none. Document Released: 05/11/2003 Document Revised: 07/05/2013 Document Reviewed: 07/28/2012 [facility] Patient Information 2015 [place], [place]. This information is not intended to replace advice given to you by your health care provider. Make sure you discuss any questions you have with your health care provider.  Diabetes and Foot Care Diabetes may cause you to have problems because of poor blood supply (circulation) to your feet and legs. This may cause the skin on your feet to become thinner, break easier, and heal more slowly. Your skin may become dry, and the skin may peel and crack. You may also have nerve damage in your legs and feet causing decreased feeling in them. You may not notice minor injuries to your feet that could lead to infections or more serious problems. Taking care of your feet is one of the most important things you can do for yourself.  HOME CARE INSTRUCTIONS  Wear shoes at all times, even in the house. Do not go barefoot. Bare feet are easily injured.  Check your feet daily for blisters, cuts, and redness. If you cannot see the bottom of your feet, use a mirror or ask someone for help.  Wash your feet with warm water (do not use hot water) and mild soap. Then pat your feet and the areas between your toes until they  are completely dry. Do not soak your feet as this can dry your skin.  Apply a moisturizing lotion or petroleum jelly (that does not contain alcohol and is unscented) to the skin on your feet and to dry, brittle toenails. Do not apply lotion between your toes.  Trim your toenails straight across. Do not dig under them or around the cuticle. File the edges of your nails with an emery board or nail file.  Do not cut corns or calluses or try to remove them with medicine.  Wear clean socks or stockings every day. Make sure they are not too tight. Do not wear knee-high stockings since they may decrease blood flow to your legs.  Wear shoes that fit properly and have enough cushioning. To break in new shoes, wear them for just a few hours a day. This prevents you from injuring your feet. Always look in your shoes before you put them on to be sure there are no objects inside.  Do not cross your legs. This may decrease the blood flow to your feet.  If you find a minor scrape, cut, or break in the skin on your feet, keep it and the skin around it clean and dry. These areas may be cleansed  with mild soap and water. Do not cleanse the area with peroxide, alcohol, or iodine.  When you remove an adhesive bandage, be sure not to damage the skin around it.  If you have a wound, look at it several times a day to make sure it is healing.  Do not use heating pads or hot water bottles. They may burn your skin. If you have lost feeling in your feet or legs, you may not know it is happening until it is too late.  Make sure your health care provider performs a complete foot exam at least annually or more often if you have foot problems. Report any cuts, sores, or bruises to your health care provider immediately. SEEK MEDICAL CARE IF:   You have an injury that is not healing.  You have cuts or breaks in the skin.  You have an ingrown nail.  You notice redness on your legs or feet.  You feel burning or  tingling in your legs or feet.  You have pain or cramps in your legs and feet.  Your legs or feet are numb.  Your feet always feel cold. SEEK IMMEDIATE MEDICAL CARE IF:   There is increasing redness, swelling, or pain in or around a wound.  There is a red line that goes up your leg.  Pus is coming from a wound.  You develop a fever or as directed by your health care provider.  You notice a bad smell coming from an ulcer or wound. Document Released: 02/16/2000 Document Revised: 10/21/2012 Document Reviewed: 07/28/2012 [facility] Patient Information 2015 [place], [place]. This information is not intended to replace advice given to you by your health care provider. Make sure you discuss any questions you have with your health care provider.  Blood Glucose Monitoring Monitoring your blood glucose (also know as blood sugar) helps you to manage your diabetes. It also helps you and your health care provider monitor your diabetes and determine how well your treatment plan is working. WHY SHOULD YOU MONITOR YOUR BLOOD GLUCOSE?  It can help you understand how food, exercise, and medicine affect your blood glucose.  It allows you to know what your blood glucose is at any given moment. You can quickly tell if you are having low blood glucose (hypoglycemia) or high blood glucose (hyperglycemia).  It can help you and your health care provider know how to adjust your medicines.  It can help you understand how to manage an illness or adjust medicine for exercise. WHEN SHOULD YOU TEST? Your health care provider will help you decide how often you should check your blood glucose. This may depend on the type of diabetes you have, your diabetes control, or the types of medicines you are taking. Be sure to write down all of your blood glucose readings so that this information can be reviewed with your health care provider. See below for examples of testing times that your health care provider may  suggest. Type 1 Diabetes  Test 4 times a day if you are in good control, using an insulin pump, or perform multiple daily injections.  If your diabetes is not well controlled or if you are sick, you may need to monitor more often.  It is a good idea to also monitor:  Before and after exercise.  Between meals and 2 hours after a meal.  Occasionally between 2:00 a.m. and 3:00 a.m. Type 2 Diabetes  It can vary with each person, but generally, if you are on insulin, test  4 times a day.  If you take medicines by mouth (orally), test 2 times a day.  If you are on a controlled diet, test once a day.  If your diabetes is not well controlled or if you are sick, you may need to monitor more often. HOW TO MONITOR YOUR BLOOD GLUCOSE Supplies Needed  Blood glucose meter.  Test strips for your meter. Each meter has its own strips. You must use the strips that go with your own meter.  A pricking needle (lancet).  A device that holds the lancet (lancing device).  A journal or log book to write down your results. Procedure  Wash your hands with soap and water. Alcohol is not preferred.  Prick the side of your finger (not the tip) with the lancet.  Gently milk the finger until a small drop of blood appears.  Follow the instructions that come with your meter for inserting the test strip, applying blood to the strip, and using your blood glucose meter. Other Areas to Get Blood for Testing Some meters allow you to use other areas of your body (other than your finger) to test your blood. These areas are called alternative sites. The most common alternative sites are:  The forearm.  The thigh.  The back area of the lower leg.  The palm of the hand. The blood flow in these areas is slower. Therefore, the blood glucose values you get may be delayed, and the numbers are different from what you would get from your fingers. Do not use alternative sites if you think you are having  hypoglycemia. Your reading will not be accurate. Always use a finger if you are having hypoglycemia. Also, if you cannot feel your lows (hypoglycemia unawareness), always use your fingers for your blood glucose checks. ADDITIONAL TIPS FOR GLUCOSE MONITORING  Do not reuse lancets.  Always carry your supplies with you.  All blood glucose meters have a 24-hour "hotline" number to call if you have questions or need help.  Adjust (calibrate) your blood glucose meter with a control solution after finishing a few boxes of strips. BLOOD GLUCOSE RECORD KEEPING It is a good idea to keep a daily record or log of your blood glucose readings. Most glucose meters, if not all, keep your glucose records stored in the meter. Some meters come with the ability to download your records to your home computer. Keeping a record of your blood glucose readings is especially helpful if you are wanting to look for patterns. Make notes to go along with the blood glucose readings because you might forget what happened at that exact time. Keeping good records helps you and your health care provider to work together to achieve good diabetes management.  Document Released: 02/21/2003 Document Revised: 07/05/2013 Document Reviewed: 07/13/2012 [facility] Patient Information 2015 [place], [place]. This information is not intended to replace advice given to you by your health care provider. Make sure you discuss any questions you have with your health care provider.  Emergency Department Resource Guide 1) Find a Doctor and Pay Out of Pocket Although you won't have to find out who is covered by your insurance plan, it is a good idea to ask around and get recommendations. You will then need to call the office and see if the doctor you have chosen will accept you as a new patient and what types of options they offer for patients who are self-pay. Some doctors offer discounts or will set up payment plans for their patients who  do not have  insurance, but you will need to ask so you aren't surprised when you get to your appointment.  2) Contact Your Local Health Department Not all health departments have doctors that can see patients for sick visits, but many do, so it is worth a call to see if yours does. If you don't know where your local health department is, you can check in your phone book. The CDC also has a tool to help you locate your state's health department, and many state websites also have listings of all of their local health departments.  3) Find a [facility] If your illness is not likely to be very severe or complicated, you may want to try a walk in clinic. These are popping up all over the country in pharmacies, drugstores, and shopping centers. They're usually staffed by nurse practitioners or physician assistants that have been trained to treat common illnesses and complaints. They're usually fairly quick and inexpensive. However, if you have serious medical issues or chronic medical problems, these are probably not your best option.  No Primary Care Doctor: - Call [facility] at  [phone] - they can help you locate a primary care doctor that  accepts your insurance, provides certain services, etc. - Physician Referral Service- [phone]  Chronic Pain Problems: Organization         Address  Phone   Notes  [facility]  [phone] Patients need to be referred by their primary care doctor.   Medication Assistance: Organization         Address  Phone   Notes  [facility] Medication [facility] [address] [place], [place] [address] [phone] --Must be a resident of [facility] -- Must have NO insurance coverage whatsoever (no Medicaid/ Medicare, etc.) -- The pt. MUST have a primary care doctor that directs their care regularly and follows them in the community   [facility]  [phone]   [employer]  [phone]    Agencies that provide  inexpensive medical care: Organization         Address  Phone   Notes  [place]  [phone]   [facility]    [phone]   [facility] [place], [place] [address] [phone]   [place] [address], [place] [phone]   [facility]    [phone]   [facility]    [phone]   [place] [address], [place] Phone:  [phone], Fax:  [phone] Hours of Operation:  9 am - 6 pm, M-F.  Also accepts Medicaid/Medicare and self-pay.  [facility] for [place] [address], [address], [place] Phone: [phone], Fax: [phone]. Hours of Operation:  8:30 am - 5:30 pm, M-F.  Also accepts Medicaid and self-pay.  [facility] [address], [place] Phone: [phone]   [place], [place], [place] [phone], Ext. [phone] Mondays & Thursdays: 7-9 AM.  First 15 patients are seen on a first come, first serve basis.    [place] Providers:  Organization         Address  Phone   Notes  [facility] [address], [place] [phone] Also accepts self-pay patients.  [facility]  [place], [place]  [phone]   [place], [address], [place] [phone]   [facility] [address] [place] [phone]   Lucianne Lei [address], [place]   [phone] Only accepts [place] Access [place] patients after they have their name applied to their card.   Self-Pay (no insurance) in [facility]:  Organization         Address  Phone   Notes  Sickle Cell Patients, [facility] Internal Medicine [place] [phone]   [facility] Urgent  [facility] [place] [phone]   [facility] [place]  [place], [place], [place] [phone]   [facility]/Dr. Osei-Bonsu  [address], [place] or [address], [place] [phone] Phone number for both [place] and [place] locations is the same.  [facility] [address] [place] [phone]   [facility] [address] [place] or [address] [phone]   [facility] [address] [place] [phone], phone; [phone], fax Sees patients 1st and 3rd Saturday of every month.  Must not qualify for public or private insurance (i.e. Medicaid, Medicare, [place] Health Choice, Veterans' Benefits)  Household income should be no more than 200% of the poverty level The clinic cannot treat you if you are pregnant or think you are pregnant  Sexually transmitted diseases are not treated at the clinic.    Dental Care: Organization         Address  Phone  Notes  [facility] Department of [facility] [phone] Accepts children up to age 70 who are enrolled in [place] or [place]; pregnant women with a Medicaid card; and children who have applied for Medicaid or [place] Health Choice, but were declined, whose parents can pay a reduced fee at time of service.  [facility] Department of [facility]  [address], [place] [phone] Accepts children up to age 39 who are enrolled in [place] or [place]; pregnant women with a Medicaid card; and children who have applied for Medicaid or [place] Health Choice, but were declined, whose parents can pay a reduced fee at time of service.  [facility]  [place] [phone] Patients are seen by appointment only. Walk-ins are  not accepted. [place] will see patients 46 years of age and older. Monday - Tuesday (8am-5pm) Most Wednesdays (8:30-5pm) $30 per visit, cash only  [facility] Adult Dental Access PROGRAM  [address], [facility] [phone] Patients are seen by appointment only. Walk-ins are not accepted. [place] will see patients 35 years of age and older. One Wednesday Evening (Monthly: Volunteer Based).  $30 per visit, cash only  [place]  [phone] for adults; Children under age 83, call Graduate Pediatric Dentistry at [phone]. Children aged 67-14, please call [phone] to request a pediatric application.  Dental services are provided in all areas of dental care including fillings, crowns and bridges, complete and partial dentures, implants, gum treatment, root canals, and extractions. Preventive care is also provided. Treatment is provided to both adults and children. Patients  are selected via a lottery and there is often a waiting list.   [facility] [address], [place]  [phone] [email or web address]   [facility] [address] [place], [place] [phone], Ext. [phone] Second and Fourth Thursday of each month, opens at 6:30 AM; Clinic ends at 9 AM.  Patients are seen on a first-come first-served basis, and a limited number are seen during each clinic.   [facility]  [address] Hillard Danker [place], [place] [phone]   Eligibility Requirements You must have lived in [place], [place], or [place] counties for at least the last three months.   You cannot be eligible for state or federal sponsored [employer], including [employer], [place], or [employer].   You generally cannot be eligible for healthcare insurance through your employer.    How to apply: Eligibility screenings are held every Tuesday and Wednesday afternoon from 1:00 pm until 4:00 pm. You do not need an appointment for  the interview!  [facility] [address] [place], [place]   [place]  [place]  [place]  [phone]    Behavioral Health Resources in the Community: Intensive Outpatient Programs Organization         Address  Phone  Notes  [place] Reid. [address], [place], [place] [phone]   [facility] Outpatient [address] [place], [place]   ADS: Alcohol [facility] Drug S[facility] [address], [employer], [place]   [place] [address]  [place], [place] or [phone]   Substance Abuse Resources Organization         Address  Phone  Notes  Alcohol and Drug Services  [phone]   [place]  [phone]   [facility] [place]   Chinita Pester  [phone]   Residential & Outpatient Substance Abuse Program  [phone]   Psychological Services Organization         Address  Phone  Notes  [facility] [place]  [place]  [phone]   [place] [address] [place] or [phone]    Mobile Crisis Teams Organization         Address  Phone  Notes  [facility], Mobile Crisis Care Unit  [phone]   [facility]  [address] [place], [place]   [place] [address] [place] [phone]    Self-Help/Support Groups Organization         Address  Phone             Notes  [place]. of [place] - variety of support groups  [place] Call for more information  Narcotics Anonymous (NA), [facility] [address], [employer] [place]  2 meetings at this location   Special educational needs teacher         Address  Phone  Notes  [facility] Residential Treatment  [place],    [place]  [phone]   [facility]  [address] [place] [phone], [place], [place]   [place], [place] [phone] Admissions: 8am-3pm M-F  Incentives Substance [place] [address]    [place], [place]   [facility] [place]  [address] Jacinto Reap [place], [place]   [facility] [address]  [place], [place]   [facility] - Intensive Outpatient [address] Kristeen [address], [place], [place]   [facility] [place] ([place] [address].,  [place], [place] [phone] or [phone]   [facility] Services (RTS) [address], [place], [place] Accepts Medicaid  [facility] [place] [address]  [place] [phone] Substance Abuse/Addiction Treatment   [facility] Organization         Address  Phone  Notes  [facility]  [phone]   Domenic Schwab, PhD [address] Arlis Porta [place], [place]   [phone] or [phone]   [place]um[place] Sayner [place], [place] [phone]   [address], [place], [place] [phone] Insurance/Medicaid/sponsorship through [facility] and Families [address]                                    [place], [place] [phone] [place] [address], [place] [phone]    Dr. Adele Schilder  [phone]   [facility] Dept. 1) [address] [place] 2) [place] 3)  [place] [address], [place] [phone]  [phone]   [place] [phone] or [phone] (After Hours)
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
## 2013-12-01 NOTE — ED Provider Notes (Signed)
Medical screening examination/treatment/procedure(s) were conducted as a shared visit with non-physician practitioner(s) or resident and myself. I personally evaluated the patient during the encounter and agree with the findings.  I have personally reviewed any xrays and/ or EKG's with the provider and I agree with interpretation.  2 diabetes who has not been taking his medications for the past 3 months due to side effects. Patient has been trying to improve his diet and exercise however symptoms continued to worsen. Sugars have been running 300-600 range. Patient has never been on insulin. Patient feels general malaise, fatigue, increased urination. On exam mild diabetes membranes, well-appearing, abdomen soft nontender, heart rate regular rate and rhythm. Discussed importance of continuing to improve diet, exercise and close followup outpatient as patient may need Lantus or insulin. Plan to refill prescription in the short-term.  Uncontrolled diabetes, hyperglycemia   Mariea Clonts, MD 12/01/13 [phone]

## 2013-12-02 NOTE — ED Provider Notes (Signed)
Medical screening examination/treatment/procedure(s) were performed by resident physician or non-physician practitioner and as supervising physician I was immediately available for consultation/collaboration.   Kambre Messner DOUGLAS MD.   Mallery Harshman D Rain [place]hide, MD 12/02/13 2110 

## 2014-03-17 ENCOUNTER — Emergency Department (HOSPITAL_COMMUNITY): Payer: Managed Care, Other (non HMO)

## 2014-03-17 ENCOUNTER — Encounter (HOSPITAL_COMMUNITY): Payer: Self-pay | Admitting: *Deleted

## 2014-03-17 ENCOUNTER — Inpatient Hospital Stay (HOSPITAL_COMMUNITY)
Admission: EM | Admit: 2014-03-17 | Discharge: 2014-03-18 | DRG: 072 | Disposition: A | Discharge status: Home / Self Care | Payer: Managed Care, Other (non HMO) | Attending: Internal Medicine | Admitting: Internal Medicine

## 2014-03-17 DIAGNOSIS — E119 Type 2 diabetes mellitus without complications: Secondary | ICD-10-CM

## 2014-03-17 DIAGNOSIS — R4182 Altered mental status, unspecified: Secondary | ICD-10-CM | POA: Diagnosis not present

## 2014-03-17 DIAGNOSIS — R739 Hyperglycemia, unspecified: Secondary | ICD-10-CM

## 2014-03-17 DIAGNOSIS — W3400XA Accidental discharge from unspecified firearms or gun, initial encounter: Secondary | ICD-10-CM

## 2014-03-17 DIAGNOSIS — I10 Essential (primary) hypertension: Secondary | ICD-10-CM | POA: Diagnosis present

## 2014-03-17 DIAGNOSIS — Z881 Allergy status to other antibiotic agents status: Secondary | ICD-10-CM

## 2014-03-17 DIAGNOSIS — R4781 Slurred speech: Secondary | ICD-10-CM

## 2014-03-17 DIAGNOSIS — G9340 Encephalopathy, unspecified: Principal | ICD-10-CM | POA: Diagnosis present

## 2014-03-17 DIAGNOSIS — M25512 Pain in left shoulder: Secondary | ICD-10-CM

## 2014-03-17 DIAGNOSIS — Z9119 Patient's noncompliance with other medical treatment and regimen: Secondary | ICD-10-CM | POA: Diagnosis present

## 2014-03-17 LAB — PROTIME-INR
INR: 0.98 (ref 0.00–1.49)
Prothrombin Time: 13.1 seconds (ref 11.6–15.2)

## 2014-03-17 LAB — I-STAT CHEM 8, ED
BUN: 18 mg/dL (ref 6–23)
CALCIUM ION: 1.1 mmol/L — AB (ref 1.12–1.23)
CREATININE: 1.1 mg/dL (ref 0.50–1.35)
Chloride: 100 mEq/L (ref 96–112)
Glucose, Bld: 323 mg/dL — ABNORMAL HIGH (ref 70–99)
HCT: 50 % (ref 39.0–52.0)
Hemoglobin: 17 g/dL (ref 13.0–17.0)
POTASSIUM: 4.6 mmol/L (ref 3.5–5.1)
Sodium: 138 mmol/L (ref 135–145)
TCO2: 21 mmol/L (ref 0–100)

## 2014-03-17 LAB — I-STAT TROPONIN, ED: Troponin i, poc: 0 ng/mL (ref 0.00–0.08)

## 2014-03-17 LAB — DIFFERENTIAL
BASOS ABS: 0 10*3/uL (ref 0.0–0.1)
Basophils Relative: 0 % (ref 0–1)
Eosinophils Absolute: 0.2 10*3/uL (ref 0.0–0.7)
Eosinophils Relative: 2 % (ref 0–5)
LYMPHS ABS: 2.5 10*3/uL (ref 0.7–4.0)
Lymphocytes Relative: 36 % (ref 12–46)
Monocytes Absolute: 0.4 10*3/uL (ref 0.1–1.0)
Monocytes Relative: 6 % (ref 3–12)
Neutro Abs: 3.8 10*3/uL (ref 1.7–7.7)
Neutrophils Relative %: 56 % (ref 43–77)

## 2014-03-17 LAB — CBG MONITORING, ED: GLUCOSE-CAPILLARY: 311 mg/dL — AB (ref 70–99)

## 2014-03-17 LAB — CBC
HCT: 45.2 % (ref 39.0–52.0)
Hemoglobin: 15.9 g/dL (ref 13.0–17.0)
MCH: 28 pg (ref 26.0–34.0)
MCHC: 35.2 g/dL (ref 30.0–36.0)
MCV: 79.7 fL (ref 78.0–100.0)
Platelets: 406 10*3/uL — ABNORMAL HIGH (ref 150–400)
RBC: 5.67 MIL/uL (ref 4.22–5.81)
RDW: 12.9 % (ref 11.5–15.5)
WBC: 6.8 10*3/uL (ref 4.0–10.5)

## 2014-03-17 LAB — APTT: APTT: 29 s (ref 24–37)
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
## 2014-03-17 NOTE — ED Notes (Signed)
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 Pt. Has been feeling weak and sluggish all week. CBG on EMS arrival was 324.

## 2014-03-17 NOTE — ED Notes (Signed)
Activated CODE STROKE

## 2014-03-18 ENCOUNTER — Telehealth: Payer: Self-pay | Admitting: *Deleted

## 2014-03-18 ENCOUNTER — Inpatient Hospital Stay (HOSPITAL_COMMUNITY): Payer: Managed Care, Other (non HMO)

## 2014-03-18 DIAGNOSIS — Z881 Allergy status to other antibiotic agents status: Secondary | ICD-10-CM | POA: Diagnosis not present

## 2014-03-18 DIAGNOSIS — R4182 Altered mental status, unspecified: Secondary | ICD-10-CM | POA: Diagnosis present

## 2014-03-18 DIAGNOSIS — I10 Essential (primary) hypertension: Secondary | ICD-10-CM | POA: Diagnosis present

## 2014-03-18 DIAGNOSIS — G9340 Encephalopathy, unspecified: Principal | ICD-10-CM

## 2014-03-18 DIAGNOSIS — Z9119 Patient's noncompliance with other medical treatment and regimen: Secondary | ICD-10-CM | POA: Diagnosis present

## 2014-03-18 DIAGNOSIS — M25512 Pain in left shoulder: Secondary | ICD-10-CM | POA: Diagnosis present

## 2014-03-18 DIAGNOSIS — E119 Type 2 diabetes mellitus without complications: Secondary | ICD-10-CM | POA: Diagnosis present

## 2014-03-18 LAB — CBG MONITORING, ED
GLUCOSE-CAPILLARY: 267 mg/dL — AB (ref 70–99)
Glucose-Capillary: 266 mg/dL — ABNORMAL HIGH (ref 70–99)
Glucose-Capillary: 265 mg/dL — ABNORMAL HIGH (ref 70–99)

## 2014-03-18 LAB — COMPREHENSIVE METABOLIC PANEL
ALT: 21 U/L (ref 0–53)
ANION GAP: 10 (ref 5–15)
AST: 22 U/L (ref 0–37)
Albumin: 4.3 g/dL (ref 3.5–5.2)
Alkaline Phosphatase: 61 U/L (ref 39–117)
BILIRUBIN TOTAL: 0.5 mg/dL (ref 0.3–1.2)
BUN: 14 mg/dL (ref 6–23)
CO2: 24 mmol/L (ref 19–32)
CREATININE: 1.14 mg/dL (ref 0.50–1.35)
Calcium: 9.1 mg/dL (ref 8.4–10.5)
Chloride: 102 mEq/L (ref 96–112)
GFR, EST NON AFRICAN AMERICAN: 86 mL/min — AB (ref >90)
Glucose, Bld: 322 mg/dL — ABNORMAL HIGH (ref 70–99)
POTASSIUM: 4.5 mmol/L (ref 3.5–5.1)
SODIUM: 136 mmol/L (ref 135–145)
Total Protein: 7.2 g/dL (ref 6.0–8.3)

## 2014-03-18 LAB — RAPID URINE DRUG SCREEN, HOSP PERFORMED
Amphetamines: NOT DETECTED
BARBITURATES: NOT DETECTED
BENZODIAZEPINES: NOT DETECTED
Cocaine: NOT DETECTED
Opiates: NOT DETECTED
TETRAHYDROCANNABINOL: NOT DETECTED

## 2014-03-18 MED ORDER — SODIUM CHLORIDE 0.9 % IV BOLUS (SEPSIS)
1000.0000 mL | Freq: Once | INTRAVENOUS | Status: AC
  Administered 2014-03-18: 1000 mL via INTRAVENOUS

## 2014-03-18 MED ORDER — SODIUM CHLORIDE 0.9 % IV SOLN
INTRAVENOUS | Status: DC
  Administered 2014-03-18: 04:00:00 via INTRAVENOUS

## 2014-03-18 MED ORDER — INSULIN GLARGINE 100 UNIT/ML ~~LOC~~ SOLN: 10.0000 [IU] | Freq: Every day | SUBCUTANEOUS | Status: DC

## 2014-03-18 MED ORDER — OXYCODONE HCL 5 MG PO TABS
5.0000 mg | ORAL_TABLET | ORAL | Status: DC | PRN
  Administered 2014-03-18: 5 mg via ORAL
  Filled 2014-03-18: qty 1

## 2014-03-18 MED ORDER — INSULIN ASPART 100 UNIT/ML ~~LOC~~ SOLN: 0.0000 [IU] | Freq: Three times a day (TID) | SUBCUTANEOUS | Status: DC

## 2014-03-18 MED ORDER — INSULIN ASPART 100 UNIT/ML ~~LOC~~ SOLN
14.0000 [IU] | Freq: Three times a day (TID) | SUBCUTANEOUS | Status: DC
  Administered 2014-03-18: 14 [IU] via SUBCUTANEOUS
  Filled 2014-03-18: qty 1

## 2014-03-18 MED ORDER — INSULIN ASPART 100 UNIT/ML ~~LOC~~ SOLN: 14.0000 [IU] | Freq: Three times a day (TID) | SUBCUTANEOUS | Status: DC

## 2014-03-18 NOTE — ED Notes (Signed)
Pt. Mom upset that RN failed pt. On the swallow screen. Pt. Mom states RN doesn't know what she is talking about. RN explains to pt. Mom that pt. Needs to be evaluated by SLP due to pt. coughing after every sip of water and bite of cracker. Pt. Mom requests to speak to Charge. RN tries to remove remaining cups of liquid and crackers so pt. Mom doesn't force feed pt. Fluids and food. RN informs charge nurse of situation. RN informs MD of situation.

## 2014-03-18 NOTE — Telephone Encounter (Signed)
Called to inform pt wife of upcoming appointment scheduled for 03/28/14 @1000 .

## 2014-03-18 NOTE — ED Notes (Signed)
Dr. Tera Helper at bedside.
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
## 2014-03-18 NOTE — ED Notes (Signed)
Meal tray ordered for patient.

## 2014-03-18 NOTE — Progress Notes (Signed)
Patient seen and examined, reviewed history and physical and neurology consultation. Patient completely awake and alert, no focal deficits. Speech is clear. He is requesting discharge, mother at bedside also requesting discharge. He has been noncompliant with insulin since Christmas. Etiology of what happened to him yesterday remains unknown. I spoke in person with Dr. Nicole Kindred neurology on-call, who does not have any further recommendation and this recommends discharge as well. I have asked the nurse to ambulate the patient, advance diet, and if he does okay with these he should be able to go home and follow-up with his regular doctor.
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
## 2014-03-18 NOTE — ED Notes (Addendum)
RN swallow Screen unable to be completed at this time due to pt. Being unable to follow commands.

## 2014-03-18 NOTE — ED Notes (Signed)
MD went in to talk with pt. Mom about the importance of keeping the pt. NPO due to the failed swallow screen. Pt. Mom refused to listen to MD. MD informed RN. RN spoke with charge nurse about calling security to remove pt. Mom because she is interfering with pt. Care and safety. Security is in the room.

## 2014-03-18 NOTE — ED Notes (Signed)
PT comfortable with discharge and follow up instructions. Prescriptions x2.

## 2014-03-18 NOTE — ED Notes (Signed)
Bladder scan done. 549 ml in bladder
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
## 2014-03-18 NOTE — Discharge Instructions (Signed)
NO DRIVING TILL SEEN BY YOUR PRIMARY MD

## 2014-03-18 NOTE — ED Notes (Signed)
Pt cbg 267. RN notified.

## 2014-03-18 NOTE — ED Notes (Signed)
Patient given [place] to eat s/p insulin injection. Dr. Tera Helper states patient OK for d/c after eating.

## 2014-03-18 NOTE — Progress Notes (Signed)
Talan Gildner J. Clydene Laming, RN, [place], [place] [phone] ED CM consulted to meet with patient concerning f/u care with PCP and patient does not have insurance. Pt presented to [facility] ED today with AMS.  Met with patient at bedside, confirmed informaton. Pt  reports not having access to f/u care with PCP. Discussed with patient importance and benefits of  establishing PCP, and not utilizing the ED for primary care needs. Pt verbalized understanding and is in agreement. Discussed other options, provided list of local  affordable PCPs.  Pt voiced  Interest in the [facility] and [place].  [facility] Brochure given with address, phone number, and the services highlighted.  Pt verbalized understanding.   NCM has call into [facility] for appointment.  Will contact pt when appointment made.  NCM called pharmacy at [facility] to ensure pt could fill prescriptions.

## 2014-03-28 ENCOUNTER — Ambulatory Visit: Payer: Managed Care, Other (non HMO) | Admitting: Internal Medicine

## 2014-07-21 ENCOUNTER — Ambulatory Visit: Payer: Managed Care, Other (non HMO) | Attending: Family Medicine | Admitting: Family Medicine

## 2014-07-21 VITALS — BP 133/81 | HR 73 | Wt 298.4 lb

## 2014-07-21 DIAGNOSIS — E119 Type 2 diabetes mellitus without complications: Secondary | ICD-10-CM | POA: Diagnosis not present

## 2014-07-21 LAB — COMPLETE METABOLIC PANEL WITH GFR
ALBUMIN: 4.1 g/dL (ref 3.5–5.2)
ALK PHOS: 48 U/L (ref 39–117)
ALT: 26 U/L (ref 0–53)
AST: 30 U/L (ref 0–37)
BILIRUBIN TOTAL: 0.5 mg/dL (ref 0.2–1.2)
BUN: 15 mg/dL (ref 6–23)
CO2: 27 mEq/L (ref 19–32)
Calcium: 9.6 mg/dL (ref 8.4–10.5)
Chloride: 99 mEq/L (ref 96–112)
Creat: 1.01 mg/dL (ref 0.50–1.35)
GFR, Est African American: >89 mL/min
GLUCOSE: 219 mg/dL — AB (ref 70–99)
Potassium: 5.4 mEq/L — ABNORMAL HIGH (ref 3.5–5.3)
SODIUM: 137 meq/L (ref 135–145)
Total Protein: 7 g/dL (ref 6.0–8.3)

## 2014-07-21 LAB — LIPID PANEL
Cholesterol: 166 mg/dL (ref 0–200)
HDL: 29 mg/dL — AB (ref >=40)
LDL CALC: 109 mg/dL — AB (ref 0–99)
TRIGLYCERIDES: 139 mg/dL (ref <150)
Total CHOL/HDL Ratio: 5.7 Ratio
VLDL: 28 mg/dL (ref 0–40)

## 2014-07-21 LAB — CBC WITH DIFFERENTIAL/PLATELET
BASOS ABS: 0 10*3/uL (ref 0.0–0.1)
BASOS PCT: 0 % (ref 0–1)
EOS ABS: 0.3 10*3/uL (ref 0.0–0.7)
EOS PCT: 4 % (ref 0–5)
HCT: 47.4 % (ref 39.0–52.0)
Hemoglobin: 16 g/dL (ref 13.0–17.0)
Lymphocytes Relative: 45 % (ref 12–46)
Lymphs Abs: 2.9 10*3/uL (ref 0.7–4.0)
MCH: 28.2 pg (ref 26.0–34.0)
MCHC: 33.8 g/dL (ref 30.0–36.0)
MCV: 83.6 fL (ref 78.0–100.0)
MONO ABS: 0.6 10*3/uL (ref 0.1–1.0)
MONOS PCT: 9 % (ref 3–12)
MPV: 9.2 fL (ref 8.6–12.4)
NEUTROS ABS: 2.7 10*3/uL (ref 1.7–7.7)
Neutrophils Relative %: 42 % — ABNORMAL LOW (ref 43–77)
PLATELETS: 419 10*3/uL — AB (ref 150–400)
RBC: 5.67 MIL/uL (ref 4.22–5.81)
RDW: 13.6 % (ref 11.5–15.5)
WBC: 6.4 10*3/uL (ref 4.0–10.5)

## 2014-07-21 MED ORDER — INSULIN GLARGINE 100 UNIT/ML ~~LOC~~ SOLN: 10.0000 [IU] | Freq: Every day | SUBCUTANEOUS | Status: DC

## 2014-07-21 MED ORDER — INSULIN ASPART 100 UNIT/ML ~~LOC~~ SOLN: 14.0000 [IU] | Freq: Three times a day (TID) | SUBCUTANEOUS | Status: DC

## 2014-07-21 MED ORDER — NEEDLES & SYRINGES MISC: Status: DC

## 2014-07-21 NOTE — Progress Notes (Signed)
Patient ID: Nicholas [name], male   DOB: 1985/11/25, 29 y.o.   MRN: [id number]   Nicholas [name], is a 29 y.o. male  VHQ:[id number]  [id number]  DOB - 08/15/1985  CC:  Chief Complaint  Patient presents with  . New patient    Diabetes   . Needs refill on  insulin       HPI: Nicholas [name] is a 29 y.o. male here today to establish medical care.He has a history of diabetes and has been out of his insulin for 3-4 weeks due to lack of funds. He was tried on metformin in the past but is unalbe to tolerate due to GI side effects and a rash. He denies other chronic illnesses or medications.   Allergies  Allergen Reactions  . Ibuprofen Nausea Only   Past Medical History  Diagnosis Date  . HTN (hypertension) 2009  . Diabetes mellitus without complication    Current Outpatient Prescriptions on File Prior to Visit  Medication Sig Dispense Refill  . insulin aspart (NOVOLOG) 100 UNIT/ML injection Inject 14 Units into the skin 3 (three) times daily with meals. (Patient not taking: Reported on 07/21/2014) 10 mL 1  . insulin glargine (LANTUS) 100 UNIT/ML injection Inject 0.1 mLs (10 Units total) into the skin at bedtime. (Patient not taking: Reported on 07/21/2014) 10 mL 11   No current facility-administered medications on file prior to visit.   Family History  Problem Relation Age of Onset  . Hypertension      GF  . Diabetes Neg Hx   . Coronary artery disease Neg Hx   . Colon cancer Neg Hx   . Prostate cancer Neg Hx    History   Social History  . Marital Status: Married    Spouse Name: N/A  . Number of Children: 0  . Years of Education: N/A   Occupational History  . Solicitor , active job    Social History Main Topics  . Smoking status: Never Smoker   . Smokeless tobacco: Never Used  . Alcohol Use: No  . Drug Use: No  . Sexual Activity: Not on file   Other Topics Concern  . Not on file   Social History Narrative   ** Merged History Encounter **        Review  of Systems: Constitutional: Negative for fever, chills,eight loss,  Fatigue.Positive for appetite changes HENT: Negative for ear pain, ear discharge.nose bleeds Eyes: Negative for pain, discharge, redness, itching and visual disturbance. Neck: Negative for pain, stiffness Respiratory: Negative for cough, shortness of breath,   Cardiovascular: Negative for chest pain, palpitations and leg swelling. Gastrointestinal: Negative for abdominal distention, abdominal pain, nausea, vomiting, diarrhea, constipations Genitourinary: Negative for dysuria, urgency, frequency, hematuria, flank pain,  Musculoskeletal: Negative for back pain, joint pain, joint  swelling, arthralgia and gait problem.Negative for weakness. Neurological: Negative for dizziness, tremors, seizures, syncope,   light-headedness, numbness and headaches.  Hematological: Negative for easy bruising or bleeding Psychiatric/Behavioral: Negative for depression, anxiety, decreased concentration, confusion   Objective:   Filed Vitals:   07/21/14 1216  BP: 133/81  Pulse: 73    Physical Exam: Constitutional: Patient appears well-developed and well-nourished. No distress. HENT: Normocephalic, atraumatic, External right and left ear normal. Oropharynx is clear and moist.  Eyes: Conjunctivae and EOM are normal. PERRLA, no scleral icterus. Neck: Normal ROM. Neck supple. No lymphadenopathy, No thyromegaly. CVS: RRR, S1/S2 +, no murmurs, no gallops, no rubs Pulmonary: Effort and breath sounds normal, no stridor, rhonchi, wheezes, rales.  Abdominal: Soft. Normoactive BS,, no distension, tenderness, rebound or guarding.  Musculoskeletal: Normal range of motion. No edema and no tenderness.  Neuro: Alert.Normal muscle tone coordination. Non-focal Skin: Skin is warm and dry. No rash noted. Not diaphoretic. No erythema. No pallor. Psychiatric: Normal mood and affect. Behavior, judgment, thought content normal.  Lab Results  Component Value  Date   WBC 6.8 03/17/2014   HGB 17.0 03/17/2014   HCT 50.0 03/17/2014   MCV 79.7 03/17/2014   PLT 406* 03/17/2014   Lab Results  Component Value Date   CREATININE 1.10 03/17/2014   BUN 18 03/17/2014   NA 138 03/17/2014   K 4.6 03/17/2014   CL 100 03/17/2014   CO2 24 03/17/2014    No results found for: HGBA1C Lipid Panel  No results found for: CHOL, TRIG, HDL, CHOLHDL, VLDL, LDLCALC     Assessment and plan:   Diabetes  Refill of Insulins and syringes and needles He is to follow-up with assigned PCP in one month.  The patient was given clear instructions to go to ER or return to medical center if symptoms don't improve, worsen or new problems develop. The patient verbalized understanding. The patient was told to call to get lab results if they haven't heard anything in the next week.     This note has been created with Surveyor, quantity. Any transcriptional errors are unintentional.    Micheline Chapman, MSN, FNP-BC [place], [place]   07/21/2014, 12:46 PM

## 2014-07-21 NOTE — Patient Instructions (Signed)
Get back on insulin as previously Keep record of BS Follow-up with Nurse in 2 weeks and bring readings Follow-up with assigned primary provider in 4 weeks We will arrange a dietary referrel  Someone with call you.

## 2014-07-28 ENCOUNTER — Ambulatory Visit: Payer: Managed Care, Other (non HMO) | Attending: Internal Medicine

## 2014-10-27 IMAGING — CR DG CHEST 2V
2 series · 2 of 2 positions shown · non-contrast
Comparison: 05/10/2010

CLINICAL DATA: Dry cough worse when supine, occasional shortness of
breath, history hypertension

CHEST - 2 VIEW

[w chest pa]
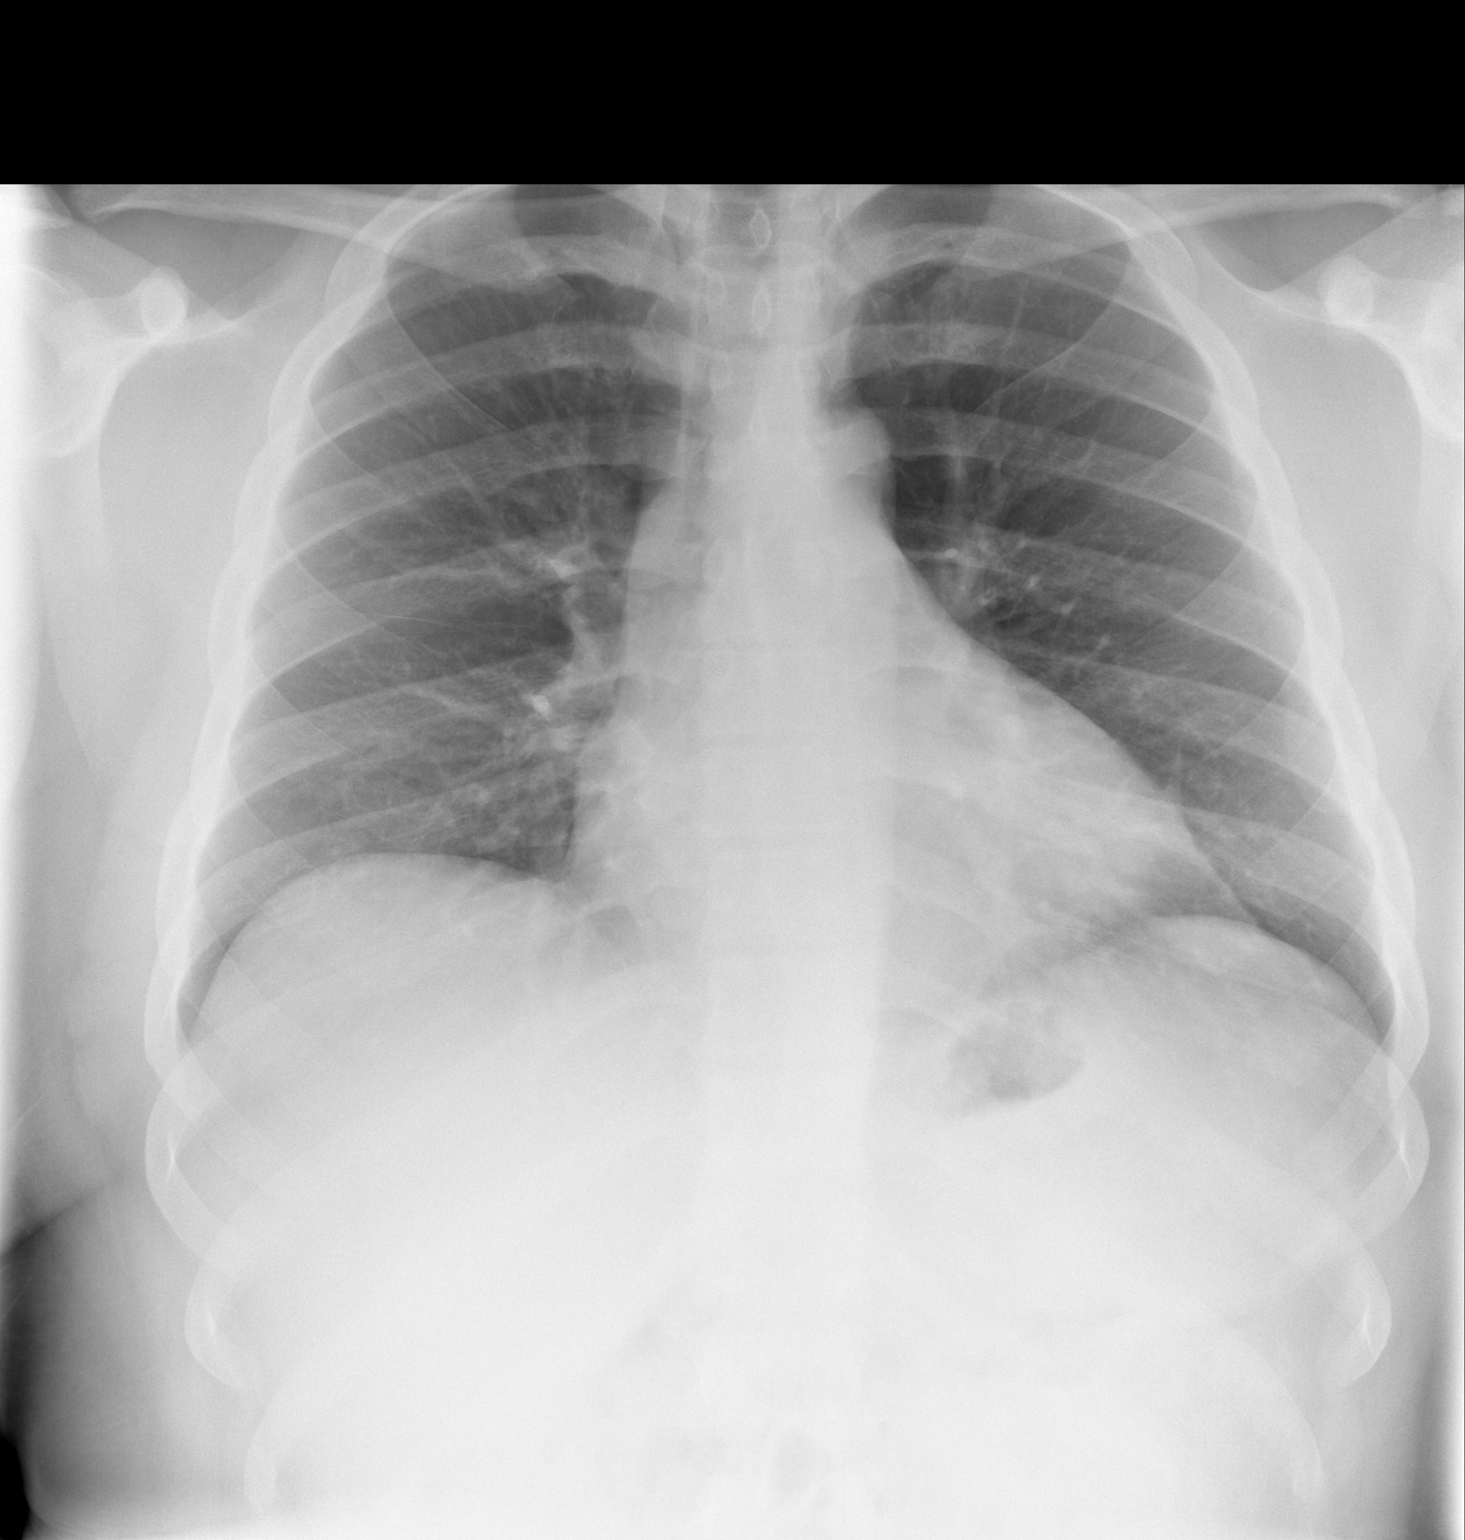

[w chest lat]
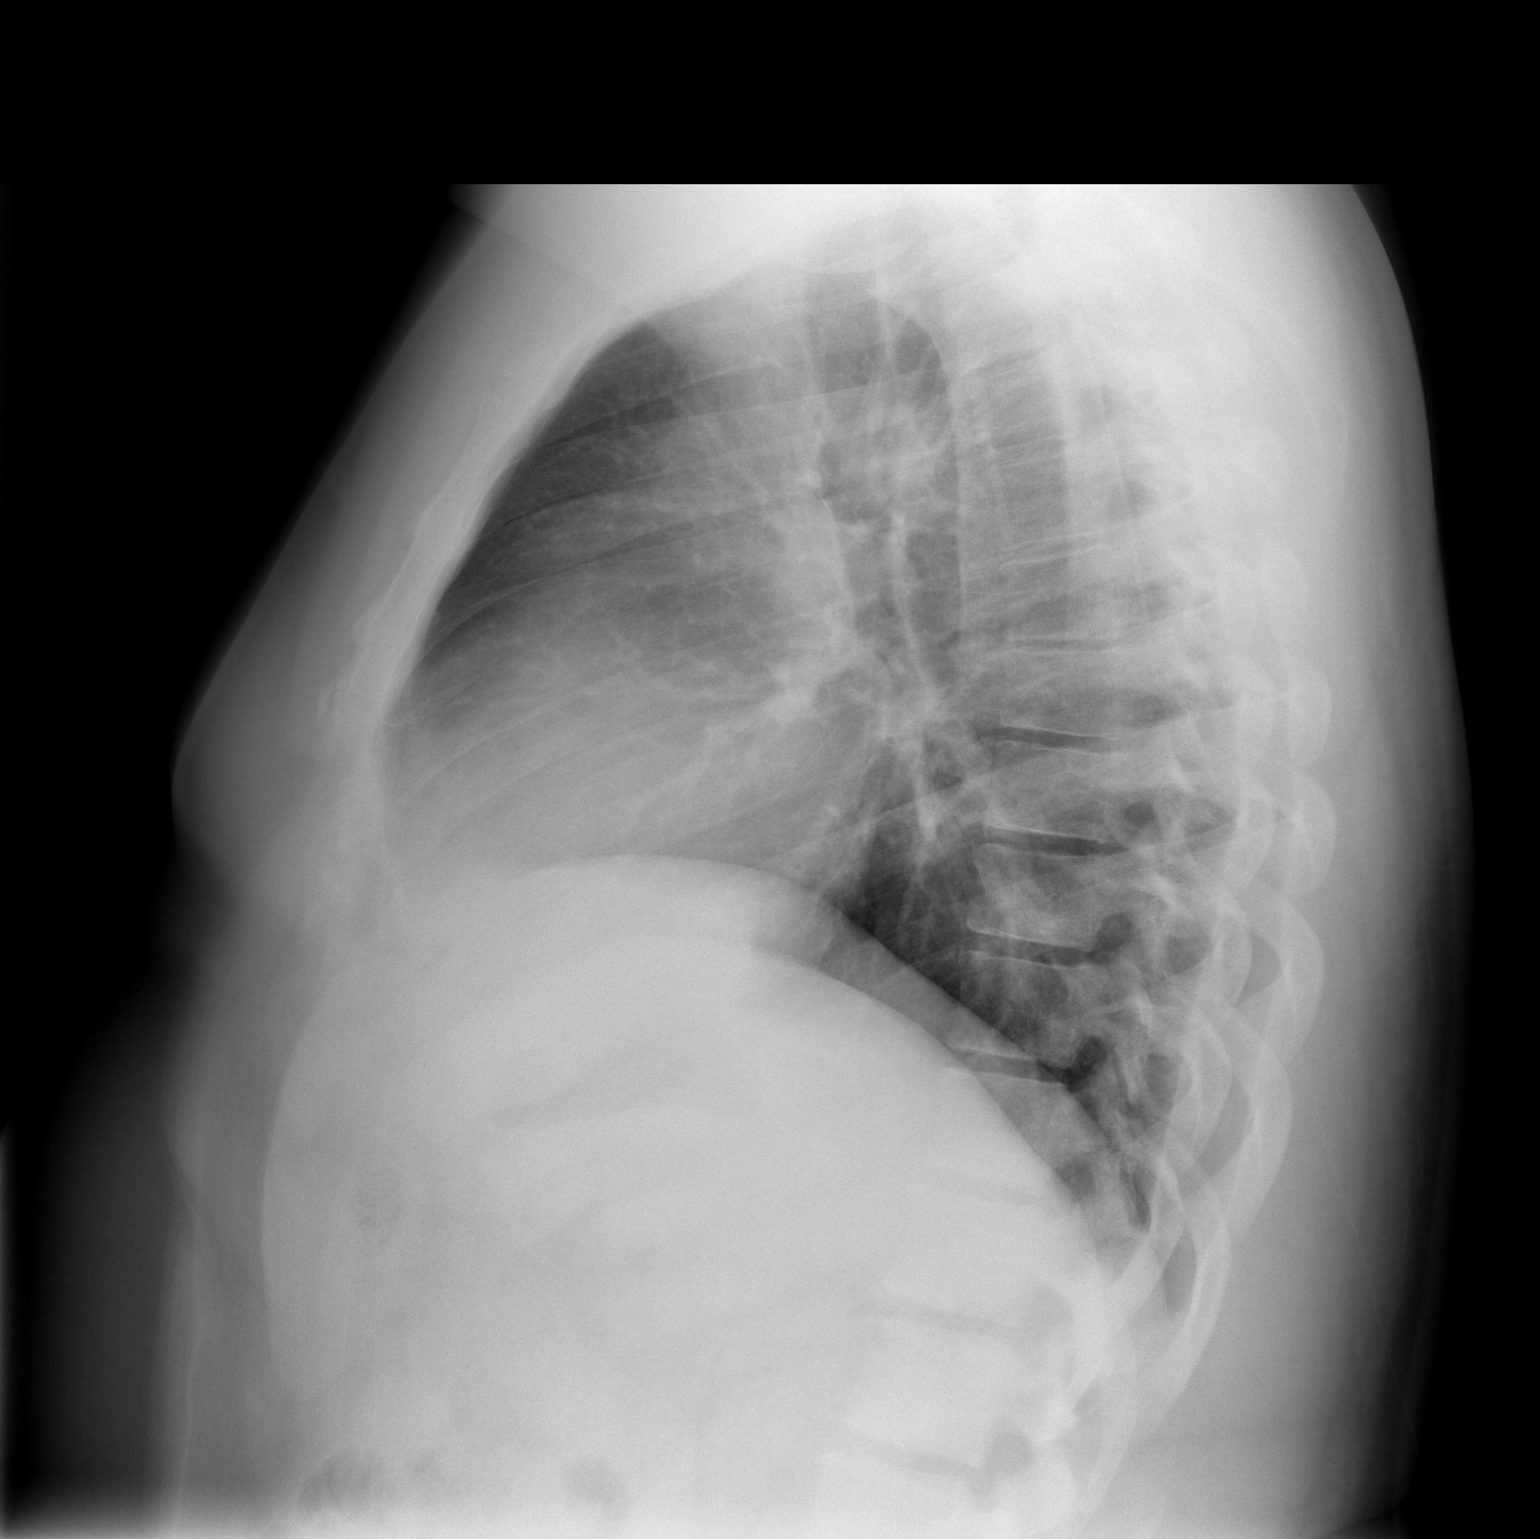

[2 of 2 positions shown; findings below may reference images not displayed]

FINDINGS: Borderline enlargement of cardiac silhouette.
Mediastinal contours and pulmonary vascularity normal.
Lungs clear.
No pleural effusion or pneumothorax.
Bones unremarkable.
IMPRESSION: Borderline enlargement of cardiac silhouette.
No acute abnormalities.

## 2014-12-01 ENCOUNTER — Emergency Department (HOSPITAL_COMMUNITY)
Admission: EM | Admit: 2014-12-01 | Discharge: 2014-12-02 | Disposition: A | Discharge status: Home / Self Care | Payer: Managed Care, Other (non HMO) | Attending: Emergency Medicine | Admitting: Emergency Medicine

## 2014-12-01 ENCOUNTER — Encounter (HOSPITAL_COMMUNITY): Payer: Self-pay | Admitting: *Deleted

## 2014-12-01 DIAGNOSIS — Z794 Long term (current) use of insulin: Secondary | ICD-10-CM | POA: Insufficient documentation

## 2014-12-01 DIAGNOSIS — R739 Hyperglycemia, unspecified: Secondary | ICD-10-CM

## 2014-12-01 DIAGNOSIS — E1165 Type 2 diabetes mellitus with hyperglycemia: Secondary | ICD-10-CM | POA: Insufficient documentation

## 2014-12-01 DIAGNOSIS — M542 Cervicalgia: Secondary | ICD-10-CM

## 2014-12-01 DIAGNOSIS — I10 Essential (primary) hypertension: Secondary | ICD-10-CM | POA: Insufficient documentation

## 2014-12-01 DIAGNOSIS — L03221 Cellulitis of neck: Secondary | ICD-10-CM | POA: Insufficient documentation

## 2014-12-01 DIAGNOSIS — IMO0002 Reserved for concepts with insufficient information to code with codable children: Secondary | ICD-10-CM

## 2014-12-01 LAB — BASIC METABOLIC PANEL
Anion gap: 10 (ref 5–15)
BUN: 15 mg/dL (ref 6–20)
CALCIUM: 8.9 mg/dL (ref 8.9–10.3)
CO2: 23 mmol/L (ref 22–32)
CREATININE: 1.12 mg/dL (ref 0.61–1.24)
Chloride: 102 mmol/L (ref 101–111)
Glucose, Bld: 241 mg/dL — ABNORMAL HIGH (ref 65–99)
Potassium: 3.9 mmol/L (ref 3.5–5.1)
SODIUM: 135 mmol/L (ref 135–145)

## 2014-12-01 LAB — CBC
HCT: 44.3 % (ref 39.0–52.0)
Hemoglobin: 15.1 g/dL (ref 13.0–17.0)
MCH: 28.7 pg (ref 26.0–34.0)
MCHC: 34.1 g/dL (ref 30.0–36.0)
MCV: 84.2 fL (ref 78.0–100.0)
Platelets: 422 10*3/uL — ABNORMAL HIGH (ref 150–400)
RBC: 5.26 MIL/uL (ref 4.22–5.81)
RDW: 13 % (ref 11.5–15.5)
WBC: 8.8 10*3/uL (ref 4.0–10.5)

## 2014-12-01 LAB — I-STAT CG4 LACTIC ACID, ED: LACTIC ACID, VENOUS: 1.36 mmol/L (ref 0.5–2.0)

## 2014-12-01 MED ORDER — MORPHINE SULFATE (PF) 4 MG/ML IV SOLN
4.0000 mg | Freq: Once | INTRAVENOUS | Status: AC
  Administered 2014-12-01: 4 mg via INTRAVENOUS
  Filled 2014-12-01: qty 1

## 2014-12-01 MED ORDER — CLINDAMYCIN PHOSPHATE 600 MG/50ML IV SOLN
600.0000 mg | Freq: Once | INTRAVENOUS | Status: AC
  Administered 2014-12-01: 600 mg via INTRAVENOUS
  Filled 2014-12-01: qty 50

## 2014-12-01 MED ORDER — HYDROCODONE-ACETAMINOPHEN 5-325 MG PO TABS: 1.0000 | ORAL_TABLET | Freq: Four times a day (QID) | ORAL | Status: DC | PRN

## 2014-12-01 MED ORDER — DOXYCYCLINE HYCLATE 100 MG PO CAPS: 100.0000 mg | ORAL_CAPSULE | Freq: Two times a day (BID) | ORAL | Status: DC
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
## 2014-12-01 NOTE — ED Notes (Signed)
Pt in c/o possible abscess to back of his neck, redness and swelling noted, pain when turning neck, no distress noted, denies fever at home

## 2014-12-01 NOTE — ED Notes (Signed)
PA at bedside.

## 2014-12-02 NOTE — ED Notes (Signed)
Pt stable, ambulatory, states understanding of discharge instructions 

## 2014-12-02 NOTE — Discharge Instructions (Signed)
Keep wound clean and dry. Apply warm compresses to affected area throughout the day. Take antibiotic until it is finished, avoid direct sunlight exposure while taking antibiotics. Take motrin or norco as directed, as needed for pain but do not drive or operate machinery with pain medication use. Followup with [facility]Primary Care doctor in 2-3 days for wound recheck. Monitor area for signs of infection to include, but not limited to: increasing pain, spreading redness, drainage/pus, worsening swelling, or fevers. Return to emergency department for emergent changing or worsening symptoms.  Also follow up with your doctor for your blood pressure, which improved here but might need to be monitored for possibility of needing medications. Eat a low salt diet.    Abscess An abscess (boil or furuncle) is an infected area on or under the skin. This area is filled with yellowish-white fluid (pus) and other material (debris). HOME CARE   Only take medicines as told by your doctor.  If you were given antibiotic medicine, take it as directed. Finish the medicine even if you start to feel better.  If gauze is used, follow your doctor's directions for changing the gauze.  To avoid spreading the infection:  Keep your abscess covered with a bandage.  Wash your hands well.  Do not share personal care items, towels, or whirlpools with others.  Avoid skin contact with others.  Keep your skin and clothes clean around the abscess.  Keep all doctor visits as told. GET HELP RIGHT AWAY IF:   You have more pain, puffiness (swelling), or redness in the wound site.  You have more fluid or blood coming from the wound site.  You have muscle aches, chills, or you feel sick.  You have a fever. MAKE SURE YOU:   Understand these instructions.  Will watch your condition.  Will get help right away if you are not doing well or get worse. Document Released: 08/07/2007 Document Revised:  08/20/2011 Document Reviewed: 05/03/2011 [facility] Patient Information 2015 [place], [place]. This information is not intended to replace advice given to you by your health care provider. Make sure you discuss any questions you have with your health care provider.  Cellulitis Cellulitis is an infection of the skin and the tissue under the skin. The infected area is usually red and tender. This happens most often in the arms and lower legs. HOME CARE   Take your antibiotic medicine as told. Finish the medicine even if you start to feel better.  Keep the infected arm or leg raised (elevated).  Put a warm cloth on the area up to 4 times per day.  Only take medicines as told by your doctor.  Keep all doctor visits as told. GET HELP IF:  You see red streaks on the skin coming from the infected area.  Your red area gets bigger or turns a dark color.  Your bone or joint under the infected area is painful after the skin heals.  Your infection comes back in the same area or different area.  You have a puffy (swollen) bump in the infected area.  You have new symptoms.  You have a fever. GET HELP RIGHT AWAY IF:   You feel very sleepy.  You throw up (vomit) or have watery poop (diarrhea).  You feel sick and have muscle aches and pains. MAKE SURE YOU:   Understand these instructions.  Will watch your condition.  Will get help right away if you are not doing well or get worse. Document Released:  08/07/2007 Document Revised: 07/05/2013 Document Reviewed: 05/06/2011 [employer] Patient Information 2015 [place], [place]. This information is not intended to replace advice given to you by your health care provider. Make sure you discuss any questions you have with your health care provider.  Hypertension Hypertension, commonly called high blood pressure, is when the force of blood pumping through your arteries is too strong. Your arteries are the blood vessels that carry blood from your heart  throughout your body. A blood pressure reading consists of a higher number over a lower number, such as 110/72. The higher number (systolic) is the pressure inside your arteries when your heart pumps. The lower number (diastolic) is the pressure inside your arteries when your heart relaxes. Ideally you want your blood pressure below 120/80. Hypertension forces your heart to work harder to pump blood. Your arteries may become narrow or stiff. Having hypertension puts you at risk for heart disease, stroke, and other problems.  RISK FACTORS Some risk factors for high blood pressure are controllable. Others are not.  Risk factors you cannot control include:   Race. You may be at higher risk if you are African American.  Age. Risk increases with age.  Gender. Men are at higher risk than women before age 86 years. After age 3, women are at higher risk than men. Risk factors you can control include:  Not getting enough exercise or physical activity.  Being overweight.  Getting too much fat, sugar, calories, or salt in your diet.  Drinking too much alcohol. SIGNS AND SYMPTOMS Hypertension does not usually cause signs or symptoms. Extremely high blood pressure (hypertensive crisis) may cause headache, anxiety, shortness of breath, and nosebleed. DIAGNOSIS  To check if you have hypertension, your health care provider will measure your blood pressure while you are seated, with your arm held at the level of your heart. It should be measured at least twice using the same arm. Certain conditions can cause a difference in blood pressure between your right and left arms. A blood pressure reading that is higher than normal on one occasion does not mean that you need treatment. If one blood pressure reading is high, ask your health care provider about having it checked again. TREATMENT  Treating high blood pressure includes making lifestyle changes and possibly taking medicine. Living a healthy lifestyle can  help lower high blood pressure. You may need to change some of your habits. Lifestyle changes may include:  Following the DASH diet. This diet is high in fruits, vegetables, and whole grains. It is low in salt, red meat, and added sugars.  Getting at least 2 hours of brisk physical activity every week.  Losing weight if necessary.  Not smoking.  Limiting alcoholic beverages.  Learning ways to reduce stress. If lifestyle changes are not enough to get your blood pressure under control, your health care provider may prescribe medicine. You may need to take more than one. Work closely with your health care provider to understand the risks and benefits. HOME CARE INSTRUCTIONS  Have your blood pressure rechecked as directed by your health care provider.   Take medicines only as directed by your health care provider. Follow the directions carefully. Blood pressure medicines must be taken as prescribed. The medicine does not work as well when you skip doses. Skipping doses also puts you at risk for problems.   Do not smoke.   Monitor your blood pressure at home as directed by your health care provider. SEEK MEDICAL CARE IF:   You  think you are having a reaction to medicines taken.  You have recurrent headaches or feel dizzy.  You have swelling in your ankles.  You have trouble with your vision. SEEK IMMEDIATE MEDICAL CARE IF:  You develop a severe headache or confusion.  You have unusual weakness, numbness, or feel faint.  You have severe chest or abdominal pain.  You vomit repeatedly.  You have trouble breathing. MAKE SURE YOU:   Understand these instructions.  Will watch your condition.  Will get help right away if you are not doing well or get worse. Document Released: 02/18/2005 Document Revised: 07/05/2013 Document Reviewed: 12/11/2012 [facility] Patient Information 2015 [place], [place]. This information is not intended to replace advice given to you by your  health care provider. Make sure you discuss any questions you have with your health care provider.  DASH Eating Plan DASH stands for "Dietary Approaches to Stop Hypertension." The DASH eating plan is a healthy eating plan that has been shown to reduce high blood pressure (hypertension). Additional health benefits may include reducing the risk of type 2 diabetes mellitus, heart disease, and stroke. The DASH eating plan may also help with weight loss. WHAT DO I NEED TO KNOW ABOUT THE DASH EATING PLAN? For the DASH eating plan, you will follow these general guidelines:  Choose foods with a percent daily value for sodium of less than 5% (as listed on the food label).  Use salt-free seasonings or herbs instead of table salt or sea salt.  Check with your health care provider or pharmacist before using salt substitutes.  Eat lower-sodium products, often labeled as "lower sodium" or "no salt added."  Eat fresh foods.  Eat more vegetables, fruits, and low-fat dairy products.  Choose whole grains. Look for the word "whole" as the first word in the ingredient list.  Choose fish and skinless chicken or [place] more often than red meat. Limit fish, poultry, and meat to 6 oz (170 g) each day.  Limit sweets, desserts, sugars, and sugary drinks.  Choose heart-healthy fats.  Limit cheese to 1 oz (28 g) per day.  Eat more home-cooked food and less restaurant, buffet, and fast food.  Limit fried foods.  Cook foods using methods other than frying.  Limit canned vegetables. If you do use them, rinse them well to decrease the sodium.  When eating at a restaurant, ask that your food be prepared with less salt, or no salt if possible. WHAT FOODS CAN I EAT? Seek help from a dietitian for individual calorie needs. Grains Whole grain or whole wheat bread. Brown rice. Whole grain or whole wheat pasta. Quinoa, bulgur, and whole grain cereals. Low-sodium cereals. Corn or whole wheat flour tortillas. Whole  grain cornbread. Whole grain crackers. Low-sodium crackers. Vegetables Fresh or frozen vegetables (raw, steamed, roasted, or grilled). Low-sodium or reduced-sodium tomato and vegetable juices. Low-sodium or reduced-sodium tomato sauce and paste. Low-sodium or reduced-sodium canned vegetables.  Fruits All fresh, canned (in natural juice), or frozen fruits. Meat and Other Protein Products Ground beef (85% or leaner), grass-fed beef, or beef trimmed of fat. Skinless chicken or [place]. Ground chicken or [place]. Pork trimmed of fat. All fish and seafood. Eggs. Dried beans, peas, or lentils. Unsalted nuts and seeds. Unsalted canned beans. Dairy Low-fat dairy products, such as skim or 1% milk, 2% or reduced-fat cheeses, low-fat ricotta or cottage cheese, or plain low-fat yogurt. Low-sodium or reduced-sodium cheeses. Fats and Oils Tub margarines without trans fats. Light or reduced-fat mayonnaise and salad dressings (reduced  sodium). Avocado. Safflower, olive, or canola oils. Natural peanut or almond butter. Other Unsalted popcorn and pretzels. The items listed above may not be a complete list of recommended foods or beverages. Contact your dietitian for more options. WHAT FOODS ARE NOT RECOMMENDED? Grains White bread. White pasta. White rice. Refined cornbread. Bagels and croissants. Crackers that contain trans fat. Vegetables Creamed or fried vegetables. Vegetables in a cheese sauce. Regular canned vegetables. Regular canned tomato sauce and paste. Regular tomato and vegetable juices. Fruits Dried fruits. Canned fruit in light or heavy syrup. Fruit juice. Meat and Other Protein Products Fatty cuts of meat. Ribs, chicken wings, bacon, sausage, bologna, salami, chitterlings, fatback, hot dogs, bratwurst, and packaged luncheon meats. Salted nuts and seeds. Canned beans with salt. Dairy Whole or 2% milk, cream, half-and-half, and cream cheese. Whole-fat or sweetened yogurt. Full-fat cheeses or blue  cheese. Nondairy creamers and whipped toppings. Processed cheese, cheese spreads, or cheese curds. Condiments Onion and garlic salt, seasoned salt, table salt, and sea salt. Canned and packaged gravies. Worcestershire sauce. Tartar sauce. Barbecue sauce. Teriyaki sauce. Soy sauce, including reduced sodium. Steak sauce. Fish sauce. Oyster sauce. Cocktail sauce. Horseradish. Ketchup and mustard. Meat flavorings and tenderizers. Bouillon cubes. Hot sauce. Tabasco sauce. Marinades. Taco seasonings. Relishes. Fats and Oils Butter, stick margarine, lard, shortening, ghee, and bacon fat. Coconut, palm kernel, or palm oils. Regular salad dressings. Other Pickles and olives. Salted popcorn and pretzels. The items listed above may not be a complete list of foods and beverages to avoid. Contact your dietitian for more information. WHERE CAN I FIND MORE INFORMATION? National Heart, Lung, and Blood Institute: travelstabloid.com Document Released: 02/07/2011 Document Revised: 07/05/2013 Document Reviewed: 12/23/2012 [facility] Patient Information 2015 [employer], [place]. This information is not intended to replace advice given to you by your health care provider. Make sure you discuss any questions you have with your health care provider.

## 2014-12-19 ENCOUNTER — Encounter (HOSPITAL_COMMUNITY): Payer: Self-pay | Admitting: *Deleted

## 2014-12-19 ENCOUNTER — Emergency Department (INDEPENDENT_AMBULATORY_CARE_PROVIDER_SITE_OTHER)
Admission: EM | Admit: 2014-12-19 | Discharge: 2014-12-19 | Disposition: A | Discharge status: Home / Self Care | Payer: Self-pay | Source: Home / Self Care | Attending: Family Medicine | Admitting: Family Medicine

## 2014-12-19 DIAGNOSIS — L0211 Cutaneous abscess of neck: Secondary | ICD-10-CM

## 2014-12-19 MED ORDER — DOXYCYCLINE HYCLATE 100 MG PO CAPS: 100.0000 mg | ORAL_CAPSULE | Freq: Two times a day (BID) | ORAL | Status: DC

## 2014-12-19 NOTE — Discharge Instructions (Signed)
Warm compress twice a day when you take the antibiotic, take all of medicine, return as needed. °
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
## 2014-12-19 NOTE — ED Notes (Signed)
Pt  Has  An abcess on  Back of  His  Neck       X  1  Week

## 2015-02-08 ENCOUNTER — Emergency Department (HOSPITAL_COMMUNITY): Payer: Self-pay

## 2015-02-08 ENCOUNTER — Encounter (HOSPITAL_COMMUNITY): Payer: Self-pay | Admitting: Emergency Medicine

## 2015-02-08 DIAGNOSIS — R079 Chest pain, unspecified: Secondary | ICD-10-CM | POA: Insufficient documentation

## 2015-02-08 DIAGNOSIS — Z792 Long term (current) use of antibiotics: Secondary | ICD-10-CM | POA: Insufficient documentation

## 2015-02-08 DIAGNOSIS — I10 Essential (primary) hypertension: Secondary | ICD-10-CM | POA: Insufficient documentation

## 2015-02-08 DIAGNOSIS — Z794 Long term (current) use of insulin: Secondary | ICD-10-CM | POA: Insufficient documentation

## 2015-02-08 DIAGNOSIS — E119 Type 2 diabetes mellitus without complications: Secondary | ICD-10-CM | POA: Insufficient documentation

## 2015-02-08 LAB — BASIC METABOLIC PANEL
Anion gap: 8 (ref 5–15)
BUN: 10 mg/dL (ref 6–20)
CALCIUM: 9.2 mg/dL (ref 8.9–10.3)
CO2: 25 mmol/L (ref 22–32)
CREATININE: 1.05 mg/dL (ref 0.61–1.24)
Chloride: 105 mmol/L (ref 101–111)
GFR calc Af Amer: >60 mL/min (ref >60)
GFR calc non Af Amer: >60 mL/min (ref >60)
GLUCOSE: 269 mg/dL — AB (ref 65–99)
Potassium: 4.1 mmol/L (ref 3.5–5.1)
Sodium: 138 mmol/L (ref 135–145)

## 2015-02-08 LAB — CBC
HEMATOCRIT: 44.7 % (ref 39.0–52.0)
Hemoglobin: 14.9 g/dL (ref 13.0–17.0)
MCH: 28.1 pg (ref 26.0–34.0)
MCHC: 33.3 g/dL (ref 30.0–36.0)
MCV: 84.3 fL (ref 78.0–100.0)
PLATELETS: 459 10*3/uL — AB (ref 150–400)
RBC: 5.3 MIL/uL (ref 4.22–5.81)
RDW: 13.3 % (ref 11.5–15.5)
WBC: 7.3 10*3/uL (ref 4.0–10.5)

## 2015-02-08 NOTE — ED Notes (Signed)
Pt. intermittent reports left chest pain with SOB , productive cough and diaphoresis onset 3 weeks ago .

## 2015-02-09 ENCOUNTER — Emergency Department (HOSPITAL_COMMUNITY)
Admission: EM | Admit: 2015-02-09 | Discharge: 2015-02-09 | Disposition: A | Discharge status: Home / Self Care | Payer: Self-pay | Attending: Emergency Medicine | Admitting: Emergency Medicine

## 2015-02-09 DIAGNOSIS — R079 Chest pain, unspecified: Secondary | ICD-10-CM

## 2015-02-09 LAB — I-STAT TROPONIN, ED: Troponin i, poc: 0.01 ng/mL (ref 0.00–0.08)

## 2015-02-09 NOTE — ED Notes (Signed)
Pt experiencing intermittent L sided chest pain radiating to L arm, states he's experienced it for three weeks and believes it's related to stress. Pain more constant today, and states "something doesn't feel right." Endorses fatigue, SHOB.

## 2015-02-09 NOTE — ED Notes (Signed)
MD at bedside. 
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
## 2015-02-09 NOTE — Discharge Instructions (Signed)
Ibuprofen 600 mg every 6 hours as needed for pain.  Return to the ER if symptoms significantly worsen or change.   Nonspecific Chest Pain  Chest pain can be caused by many different conditions. There is always a chance that your pain could be related to something serious, such as a heart attack or a blood clot in your lungs. Chest pain can also be caused by conditions that are not life-threatening. If you have chest pain, it is very important to follow up with your health care provider. CAUSES  Chest pain can be caused by:  Heartburn.  Pneumonia or bronchitis.  Anxiety or stress.  Inflammation around your heart (pericarditis) or lung (pleuritis or pleurisy).  A blood clot in your lung.  A collapsed lung (pneumothorax). It can develop suddenly on its own (spontaneous pneumothorax) or from trauma to the chest.  Shingles infection (varicella-zoster virus).  Heart attack.  Damage to the bones, muscles, and cartilage that make up your chest wall. This can include:  Bruised bones due to injury.  Strained muscles or cartilage due to frequent or repeated coughing or overwork.  Fracture to one or more ribs.  Sore cartilage due to inflammation (costochondritis). RISK FACTORS  Risk factors for chest pain may include:  Activities that increase your risk for trauma or injury to your chest.  Respiratory infections or conditions that cause frequent coughing.  Medical conditions or overeating that can cause heartburn.  Heart disease or family history of heart disease.  Conditions or health behaviors that increase your risk of developing a blood clot.  Having had chicken pox (varicella zoster). SIGNS AND SYMPTOMS Chest pain can feel like:  Burning or tingling on the surface of your chest or deep in your chest.  Crushing, pressure, aching, or squeezing pain.  Dull or sharp pain that is worse when you move, cough, or take a deep breath.  Pain that is also felt in your back,  neck, shoulder, or arm, or pain that spreads to any of these areas. Your chest pain may come and go, or it may stay constant. DIAGNOSIS Lab tests or other studies may be needed to find the cause of your pain. Your health care provider may have you take a test called an ambulatory ECG (electrocardiogram). An ECG records your heartbeat patterns at the time the test is performed. You may also have other tests, such as:  Transthoracic echocardiogram (TTE). During echocardiography, sound waves are used to create a picture of all of the heart structures and to look at how blood flows through your heart.  Transesophageal echocardiogram (TEE).This is a more advanced imaging test that obtains images from inside your body. It allows your health care provider to see your heart in finer detail.  Cardiac monitoring. This allows your health care provider to monitor your heart rate and rhythm in real time.  Holter monitor. This is a portable device that records your heartbeat and can help to diagnose abnormal heartbeats. It allows your health care provider to track your heart activity for several days, if needed.  Stress tests. These can be done through exercise or by taking medicine that makes your heart beat more quickly.  Blood tests.  Imaging tests. TREATMENT  Your treatment depends on what is causing your chest pain. Treatment may include:  Medicines. These may include:  Acid blockers for heartburn.  Anti-inflammatory medicine.  Pain medicine for inflammatory conditions.  Antibiotic medicine, if an infection is present.  Medicines to dissolve blood clots.  Medicines  to treat coronary artery disease.  Supportive care for conditions that do not require medicines. This may include:  Resting.  Applying heat or cold packs to injured areas.  Limiting activities until pain decreases. HOME CARE INSTRUCTIONS  If you were prescribed an antibiotic medicine, finish it all even if you start to  feel better.  Avoid any activities that bring on chest pain.  Do not use any tobacco products, including cigarettes, chewing tobacco, or electronic cigarettes. If you need help quitting, ask your health care provider.  Do not drink alcohol.  Take medicines only as directed by your health care provider.  Keep all follow-up visits as directed by your health care provider. This is important. This includes any further testing if your chest pain does not go away.  If heartburn is the cause for your chest pain, you may be told to keep your head raised (elevated) while sleeping. This reduces the chance that acid will go from your stomach into your esophagus.  Make lifestyle changes as directed by your health care provider. These may include:  Getting regular exercise. Ask your health care provider to suggest some activities that are safe for you.  Eating a heart-healthy diet. A registered dietitian can help you to learn healthy eating options.  Maintaining a healthy weight.  Managing diabetes, if necessary.  Reducing stress. SEEK MEDICAL CARE IF:  Your chest pain does not go away after treatment.  You have a rash with blisters on your chest.  You have a fever. SEEK IMMEDIATE MEDICAL CARE IF:   Your chest pain is worse.  You have an increasing cough, or you cough up blood.  You have severe abdominal pain.  You have severe weakness.  You faint.  You have chills.  You have sudden, unexplained chest discomfort.  You have sudden, unexplained discomfort in your arms, back, neck, or jaw.  You have shortness of breath at any time.  You suddenly start to sweat, or your skin gets clammy.  You feel nauseous or you vomit.  You suddenly feel light-headed or dizzy.  Your heart begins to beat quickly, or it feels like it is skipping beats. These symptoms may represent a serious problem that is an emergency. Do not wait to see if the symptoms will go away. Get medical help right  away. Call your local emergency services (911 in the [place] Do not drive yourself to the hospital.   This information is not intended to replace advice given to you by your health care provider. Make sure you discuss any questions you have with your health care provider.   Document Released: 11/28/2004 Document Revised: 03/11/2014 Document Reviewed: 09/24/2013 Elsevier Interactive Patient Education [employer].

## 2015-02-11 ENCOUNTER — Emergency Department (HOSPITAL_COMMUNITY)
Admission: EM | Admit: 2015-02-11 | Discharge: 2015-02-11 | Disposition: A | Discharge status: Home / Self Care | Payer: Self-pay | Attending: Emergency Medicine | Admitting: Emergency Medicine

## 2015-02-11 ENCOUNTER — Encounter (HOSPITAL_COMMUNITY): Payer: Self-pay

## 2015-02-11 DIAGNOSIS — Z794 Long term (current) use of insulin: Secondary | ICD-10-CM | POA: Insufficient documentation

## 2015-02-11 DIAGNOSIS — I10 Essential (primary) hypertension: Secondary | ICD-10-CM | POA: Insufficient documentation

## 2015-02-11 DIAGNOSIS — E119 Type 2 diabetes mellitus without complications: Secondary | ICD-10-CM | POA: Insufficient documentation

## 2015-02-11 DIAGNOSIS — S61219A Laceration without foreign body of unspecified finger without damage to nail, initial encounter: Secondary | ICD-10-CM

## 2015-02-11 DIAGNOSIS — Z23 Encounter for immunization: Secondary | ICD-10-CM | POA: Insufficient documentation

## 2015-02-11 DIAGNOSIS — Y990 Civilian activity done for income or pay: Secondary | ICD-10-CM | POA: Insufficient documentation

## 2015-02-11 DIAGNOSIS — Z792 Long term (current) use of antibiotics: Secondary | ICD-10-CM | POA: Insufficient documentation

## 2015-02-11 DIAGNOSIS — S61213A Laceration without foreign body of left middle finger without damage to nail, initial encounter: Secondary | ICD-10-CM | POA: Insufficient documentation

## 2015-02-11 DIAGNOSIS — Y9389 Activity, other specified: Secondary | ICD-10-CM | POA: Insufficient documentation

## 2015-02-11 DIAGNOSIS — Y9289 Other specified places as the place of occurrence of the external cause: Secondary | ICD-10-CM | POA: Insufficient documentation

## 2015-02-11 DIAGNOSIS — W268XXA Contact with other sharp object(s), not elsewhere classified, initial encounter: Secondary | ICD-10-CM | POA: Insufficient documentation

## 2015-02-11 MED ORDER — TETANUS-DIPHTH-ACELL PERTUSSIS 5-2.5-18.5 LF-MCG/0.5 IM SUSP
0.5000 mL | Freq: Once | INTRAMUSCULAR | Status: AC
Start: 2015-02-11 — End: 2015-02-11
  Administered 2015-02-11: 0.5 mL via INTRAMUSCULAR
  Filled 2015-02-11: qty 0.5
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
## 2015-02-11 NOTE — Discharge Instructions (Signed)
Sterile Tape Wound Care °Some cuts and wounds can be closed using sterile tape, also called skin adhesive strips. Skin adhesive strips can be used for shallow (superficial) and simple cuts, wounds, lacerations, and surgical incisions. These strips act in place of stitches to hold the edges of the wound together, allowing for faster healing. Unlike stitches, the adhesive strips do not require needles or anesthetic medicine for placement. The strips will wear off naturally as the wound is healing. It is important to take proper care of your wound at home while it heals.  °HOME CARE INSTRUCTIONS °· Try to keep the area around your wound clean and dry. Do not allow the adhesive strips to get wet for the first 12 hours.   °· Do not use any soaps or ointments on the wound for the first 12 hours.   °· If a bandage (dressing) has been applied, follow your health care provider's instructions for how often to change the dressing. Keep the dressing dry if one has been applied.   °· Do not remove the adhesive strips. They will fall off on their own. If they do not, you may remove them gently after 10 days. You should gently wet the strips before removing them. For example, this can be done in the shower. °· Do not scratch, pick, or rub the wound area.   °· Protect the wound from further injury until it is healed.   °· Protect the wound from sun and tanning bed exposure while it is healing and for several weeks after healing.   °· Only take over-the-counter or prescription medicines as directed by your health care provider.   °· Keep all follow-up appointments as directed by your health care provider.   °SEEK MEDICAL CARE IF: °Your adhesive strips become wet or soaked with blood before the wound has healed. The tape will need to be replaced.  °SEEK IMMEDIATE MEDICAL CARE IF: °· You have increasing pain in the wound.   °· You develop a rash after the strips are applied. °· Your wound becomes red, swollen, hot, or tender.   °· You  have a red streak that goes away from the wound.   °· You have pus coming from the wound.   °· You have increased bleeding from the wound. °· You notice a bad smell coming from the wound.   °· Your wound breaks open. °MAKE SURE YOU: °· Understand these instructions. °· Will watch your condition. °· Will get help right away if you are not doing well or get worse. °  °This information is not intended to replace advice given to you by your health care provider. Make sure you discuss any questions you have with your health care provider. °  °Document Released: 03/28/2004 Document Revised: 03/11/2014 Document Reviewed: 09/09/2012 °Elsevier Interactive Patient Education ©2016 Elsevier Inc. ° °

## 2015-02-11 NOTE — ED Notes (Signed)
Pt verbalized understanding of d/c instructions and steristrip care. Pt stable, ambulatory and NAD.

## 2015-02-11 NOTE — ED Notes (Signed)
Pt was working this morning and cut his middle finger on his left hand on a piece of metal at work. Bleeding is controlled.

## 2015-05-14 ENCOUNTER — Encounter (HOSPITAL_COMMUNITY): Payer: Self-pay | Admitting: Family Medicine

## 2015-05-14 ENCOUNTER — Emergency Department (HOSPITAL_COMMUNITY)
Admission: EM | Admit: 2015-05-14 | Discharge: 2015-05-14 | Disposition: A | Discharge status: Home / Self Care | Payer: Self-pay | Attending: Emergency Medicine | Admitting: Emergency Medicine

## 2015-05-14 DIAGNOSIS — I10 Essential (primary) hypertension: Secondary | ICD-10-CM | POA: Insufficient documentation

## 2015-05-14 DIAGNOSIS — Z792 Long term (current) use of antibiotics: Secondary | ICD-10-CM | POA: Insufficient documentation

## 2015-05-14 DIAGNOSIS — E1165 Type 2 diabetes mellitus with hyperglycemia: Secondary | ICD-10-CM | POA: Insufficient documentation

## 2015-05-14 DIAGNOSIS — M545 Low back pain: Secondary | ICD-10-CM | POA: Insufficient documentation

## 2015-05-14 DIAGNOSIS — Z794 Long term (current) use of insulin: Secondary | ICD-10-CM | POA: Insufficient documentation

## 2015-05-14 DIAGNOSIS — R109 Unspecified abdominal pain: Secondary | ICD-10-CM | POA: Insufficient documentation

## 2015-05-14 DIAGNOSIS — R739 Hyperglycemia, unspecified: Secondary | ICD-10-CM

## 2015-05-14 LAB — URINALYSIS, ROUTINE W REFLEX MICROSCOPIC
BILIRUBIN URINE: NEGATIVE
Glucose, UA: >1000 mg/dL — AB
Hgb urine dipstick: NEGATIVE
Ketones, ur: NEGATIVE mg/dL
LEUKOCYTES UA: NEGATIVE
NITRITE: NEGATIVE
PH: 5 (ref 5.0–8.0)
Protein, ur: NEGATIVE mg/dL
SPECIFIC GRAVITY, URINE: 1.039 — AB (ref 1.005–1.030)

## 2015-05-14 LAB — BASIC METABOLIC PANEL
ANION GAP: 17 — AB (ref 5–15)
BUN: 15 mg/dL (ref 6–20)
CALCIUM: 9.2 mg/dL (ref 8.9–10.3)
CO2: 22 mmol/L (ref 22–32)
Chloride: 100 mmol/L — ABNORMAL LOW (ref 101–111)
Creatinine, Ser: 1.14 mg/dL (ref 0.61–1.24)
GLUCOSE: 299 mg/dL — AB (ref 65–99)
POTASSIUM: 4.6 mmol/L (ref 3.5–5.1)
Sodium: 139 mmol/L (ref 135–145)

## 2015-05-14 LAB — CBC
HEMATOCRIT: 46.3 % (ref 39.0–52.0)
HEMOGLOBIN: 15.8 g/dL (ref 13.0–17.0)
MCH: 28.4 pg (ref 26.0–34.0)
MCHC: 34.1 g/dL (ref 30.0–36.0)
MCV: 83.3 fL (ref 78.0–100.0)
Platelets: 436 10*3/uL — ABNORMAL HIGH (ref 150–400)
RBC: 5.56 MIL/uL (ref 4.22–5.81)
RDW: 12.8 % (ref 11.5–15.5)
WBC: 7.4 10*3/uL (ref 4.0–10.5)

## 2015-05-14 LAB — URINE MICROSCOPIC-ADD ON
BACTERIA UA: NONE SEEN
RBC / HPF: NONE SEEN RBC/hpf (ref 0–5)
SQUAMOUS EPITHELIAL / LPF: NONE SEEN

## 2015-05-14 LAB — CBG MONITORING, ED: GLUCOSE-CAPILLARY: 280 mg/dL — AB (ref 65–99)

## 2015-05-14 MED ORDER — ONDANSETRON HCL 4 MG/2ML IJ SOLN
4.0000 mg | Freq: Once | INTRAMUSCULAR | Status: AC
  Administered 2015-05-14: 4 mg via INTRAVENOUS
  Filled 2015-05-14: qty 2

## 2015-05-14 MED ORDER — IBUPROFEN 400 MG PO TABS
600.0000 mg | ORAL_TABLET | Freq: Once | ORAL | Status: AC
  Administered 2015-05-14: 600 mg via ORAL
  Filled 2015-05-14: qty 1

## 2015-05-14 MED ORDER — SODIUM CHLORIDE 0.9 % IV BOLUS (SEPSIS)
1000.0000 mL | Freq: Once | INTRAVENOUS | Status: AC
Start: 2015-05-14 — End: 2015-05-14
  Administered 2015-05-14: 1000 mL via INTRAVENOUS

## 2015-05-14 NOTE — ED Notes (Signed)
Pt cbg 280

## 2015-05-14 NOTE — ED Notes (Signed)
Pt here for frequent urination, weakness, blurred vision. sts he recently got over a virus but has been keeping his blood sugar under control. sts he feels dehydrated,.

## 2015-05-14 NOTE — ED Notes (Signed)
Patient unable to give urine sample at this time

## 2015-05-14 NOTE — Discharge Instructions (Signed)
Please read and follow all provided instructions.  Your diagnoses today include:  1. Hyperglycemia    Tests performed today include:  Vital signs. See below for your results today.   Medications prescribed:   None   Home care instructions:  Follow any educational materials contained in this packet.  Follow-up instructions: Please follow-up with your primary care provider in the next 48 hours for further evaluation of symptoms and treatment   Return instructions:   Please return to the Emergency Department if you do not get better, if you get worse, or new symptoms OR  - Fever (temperature greater than 101.63F)  - Bleeding that does not stop with holding pressure to the area    -Severe pain (please note that you may be more sore the day after your accident)  - Chest Pain  - Difficulty breathing  - Severe nausea or vomiting  - Inability to tolerate food and liquids  - Passing out  - Skin becoming red around your wounds  - Change in mental status (confusion or lethargy)  - New numbness or weakness     Please return if you have any other emergent concerns.  Additional Information:  Your vital signs today were: BP 138/87 mmHg   Pulse 75   Temp(Src) 97.8 F (36.6 C) (Oral)   Resp 18   Wt 133.471 kg   SpO2 97% If your blood pressure (BP) was elevated above 135/85 this visit, please have this repeated by your doctor within one month. ---------------
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
## 2017-01-03 ENCOUNTER — Emergency Department (HOSPITAL_BASED_OUTPATIENT_CLINIC_OR_DEPARTMENT_OTHER)
Admission: EM | Admit: 2017-01-03 | Discharge: 2017-01-03 | Disposition: A | Discharge status: Home / Self Care | Payer: Self-pay | Attending: Emergency Medicine | Admitting: Emergency Medicine

## 2017-01-03 DIAGNOSIS — Z794 Long term (current) use of insulin: Secondary | ICD-10-CM | POA: Insufficient documentation

## 2017-01-03 DIAGNOSIS — E119 Type 2 diabetes mellitus without complications: Secondary | ICD-10-CM | POA: Insufficient documentation

## 2017-01-03 DIAGNOSIS — R42 Dizziness and giddiness: Secondary | ICD-10-CM | POA: Insufficient documentation

## 2017-01-03 DIAGNOSIS — R110 Nausea: Secondary | ICD-10-CM | POA: Insufficient documentation

## 2017-01-03 DIAGNOSIS — G44219 Episodic tension-type headache, not intractable: Secondary | ICD-10-CM | POA: Insufficient documentation

## 2017-01-03 LAB — URINALYSIS, MICROSCOPIC (REFLEX)
RBC / HPF: NONE SEEN RBC/hpf (ref 0–5)
WBC, UA: NONE SEEN WBC/hpf (ref 0–5)

## 2017-01-03 LAB — CBC WITH DIFFERENTIAL/PLATELET
BASOS ABS: 0 10*3/uL (ref 0.0–0.1)
Basophils Relative: 1 %
Eosinophils Absolute: 0.1 10*3/uL (ref 0.0–0.7)
Eosinophils Relative: 1 %
HEMATOCRIT: 44.6 % (ref 39.0–52.0)
Hemoglobin: 15.3 g/dL (ref 13.0–17.0)
LYMPHS PCT: 31 %
Lymphs Abs: 2.4 10*3/uL (ref 0.7–4.0)
MCH: 28.5 pg (ref 26.0–34.0)
MCHC: 34.3 g/dL (ref 30.0–36.0)
MCV: 83.2 fL (ref 78.0–100.0)
MONO ABS: 0.7 10*3/uL (ref 0.1–1.0)
MONOS PCT: 9 %
NEUTROS ABS: 4.6 10*3/uL (ref 1.7–7.7)
Neutrophils Relative %: 58 %
Platelets: 459 10*3/uL — ABNORMAL HIGH (ref 150–400)
RBC: 5.36 MIL/uL (ref 4.22–5.81)
RDW: 13.4 % (ref 11.5–15.5)
WBC: 7.8 10*3/uL (ref 4.0–10.5)

## 2017-01-03 LAB — URINALYSIS, ROUTINE W REFLEX MICROSCOPIC
BILIRUBIN URINE: NEGATIVE
Glucose, UA: >=500 mg/dL — AB
HGB URINE DIPSTICK: NEGATIVE
KETONES UR: NEGATIVE mg/dL
Leukocytes, UA: NEGATIVE
Nitrite: NEGATIVE
PROTEIN: NEGATIVE mg/dL
Specific Gravity, Urine: 1.02 (ref 1.005–1.030)
pH: 6 (ref 5.0–8.0)

## 2017-01-03 LAB — CBG MONITORING, ED: GLUCOSE-CAPILLARY: 335 mg/dL — AB (ref 65–99)

## 2017-01-03 LAB — BASIC METABOLIC PANEL
ANION GAP: 8 (ref 5–15)
BUN: 17 mg/dL (ref 6–20)
CALCIUM: 9.2 mg/dL (ref 8.9–10.3)
CHLORIDE: 101 mmol/L (ref 101–111)
CO2: 26 mmol/L (ref 22–32)
Creatinine, Ser: 1.24 mg/dL (ref 0.61–1.24)
GFR calc Af Amer: >60 mL/min (ref >60)
GFR calc non Af Amer: >60 mL/min (ref >60)
GLUCOSE: 270 mg/dL — AB (ref 65–99)
Potassium: 4 mmol/L (ref 3.5–5.1)
Sodium: 135 mmol/L (ref 135–145)

## 2017-01-03 MED ORDER — SODIUM CHLORIDE 0.9 % IV BOLUS (SEPSIS)
1000.0000 mL | Freq: Once | INTRAVENOUS | Status: AC
  Administered 2017-01-03: 1000 mL via INTRAVENOUS

## 2017-01-03 MED ORDER — ACETAMINOPHEN 500 MG PO TABS
1000.0000 mg | ORAL_TABLET | Freq: Once | ORAL | Status: AC
  Administered 2017-01-03: 1000 mg via ORAL
  Filled 2017-01-03: qty 2

## 2017-01-03 NOTE — ED Notes (Signed)
Pt discharged to home with family. NAD.  

## 2017-01-03 NOTE — ED Triage Notes (Signed)
Tingling in his bottom lip for 2 weeks. Hx diabetes. Sob yesterday that comes and goes. Dizziness and headaches for a "while".
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
## 2017-06-09 ENCOUNTER — Encounter (HOSPITAL_COMMUNITY): Payer: Self-pay

## 2017-06-09 ENCOUNTER — Other Ambulatory Visit: Payer: Self-pay

## 2017-06-09 ENCOUNTER — Emergency Department (HOSPITAL_COMMUNITY)
Admission: EM | Admit: 2017-06-09 | Discharge: 2017-06-09 | Disposition: A | Discharge status: Home / Self Care | Payer: 59 | Attending: Emergency Medicine | Admitting: Emergency Medicine

## 2017-06-09 ENCOUNTER — Emergency Department (HOSPITAL_COMMUNITY): Payer: 59

## 2017-06-09 DIAGNOSIS — R072 Precordial pain: Secondary | ICD-10-CM | POA: Insufficient documentation

## 2017-06-09 DIAGNOSIS — E1165 Type 2 diabetes mellitus with hyperglycemia: Secondary | ICD-10-CM | POA: Diagnosis not present

## 2017-06-09 DIAGNOSIS — R0789 Other chest pain: Secondary | ICD-10-CM | POA: Diagnosis present

## 2017-06-09 DIAGNOSIS — R739 Hyperglycemia, unspecified: Secondary | ICD-10-CM

## 2017-06-09 DIAGNOSIS — Z79899 Other long term (current) drug therapy: Secondary | ICD-10-CM | POA: Insufficient documentation

## 2017-06-09 DIAGNOSIS — I10 Essential (primary) hypertension: Secondary | ICD-10-CM | POA: Diagnosis not present

## 2017-06-09 LAB — CBC
HCT: 43.9 % (ref 39.0–52.0)
Hemoglobin: 14.6 g/dL (ref 13.0–17.0)
MCH: 28.8 pg (ref 26.0–34.0)
MCHC: 33.3 g/dL (ref 30.0–36.0)
MCV: 86.6 fL (ref 78.0–100.0)
Platelets: 501 10*3/uL — ABNORMAL HIGH (ref 150–400)
RBC: 5.07 MIL/uL (ref 4.22–5.81)
RDW: 13.9 % (ref 11.5–15.5)
WBC: 7.6 10*3/uL (ref 4.0–10.5)

## 2017-06-09 LAB — BASIC METABOLIC PANEL
ANION GAP: 9 (ref 5–15)
BUN: 16 mg/dL (ref 6–20)
CALCIUM: 9 mg/dL (ref 8.9–10.3)
CO2: 26 mmol/L (ref 22–32)
Chloride: 103 mmol/L (ref 101–111)
Creatinine, Ser: 1.04 mg/dL (ref 0.61–1.24)
GFR calc Af Amer: >60 mL/min (ref >60)
GLUCOSE: 264 mg/dL — AB (ref 65–99)
Potassium: 4.5 mmol/L (ref 3.5–5.1)
Sodium: 138 mmol/L (ref 135–145)

## 2017-06-09 LAB — I-STAT TROPONIN, ED: Troponin i, poc: 0 ng/mL (ref 0.00–0.08)

## 2017-06-09 NOTE — Discharge Instructions (Signed)

## 2017-06-09 NOTE — ED Triage Notes (Signed)
Patient c/o CP that began approx 12 hours ago with SOB. States that he was seen here (3) weeks ago for same
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
## 2017-07-02 ENCOUNTER — Other Ambulatory Visit: Payer: Self-pay

## 2017-07-02 ENCOUNTER — Encounter (HOSPITAL_BASED_OUTPATIENT_CLINIC_OR_DEPARTMENT_OTHER): Payer: Self-pay | Admitting: *Deleted

## 2017-07-02 ENCOUNTER — Emergency Department (HOSPITAL_BASED_OUTPATIENT_CLINIC_OR_DEPARTMENT_OTHER)
Admission: EM | Admit: 2017-07-02 | Discharge: 2017-07-02 | Disposition: A | Discharge status: Home / Self Care | Payer: 59 | Attending: Emergency Medicine | Admitting: Emergency Medicine

## 2017-07-02 DIAGNOSIS — I10 Essential (primary) hypertension: Secondary | ICD-10-CM | POA: Diagnosis not present

## 2017-07-02 DIAGNOSIS — M545 Low back pain, unspecified: Secondary | ICD-10-CM

## 2017-07-02 DIAGNOSIS — Z794 Long term (current) use of insulin: Secondary | ICD-10-CM | POA: Insufficient documentation

## 2017-07-02 DIAGNOSIS — E119 Type 2 diabetes mellitus without complications: Secondary | ICD-10-CM | POA: Insufficient documentation

## 2017-07-02 LAB — URINALYSIS, ROUTINE W REFLEX MICROSCOPIC
Bilirubin Urine: NEGATIVE
Glucose, UA: >=500 mg/dL — AB
Ketones, ur: NEGATIVE mg/dL
LEUKOCYTES UA: NEGATIVE
Nitrite: NEGATIVE
PROTEIN: NEGATIVE mg/dL
Specific Gravity, Urine: 1.015 (ref 1.005–1.030)
pH: 5.5 (ref 5.0–8.0)

## 2017-07-02 LAB — URINALYSIS, MICROSCOPIC (REFLEX)
BACTERIA UA: NONE SEEN
WBC, UA: NONE SEEN WBC/hpf (ref 0–5)

## 2017-07-02 MED ORDER — LIDOCAINE 5 % EX PTCH: 1.0000 | MEDICATED_PATCH | CUTANEOUS | 0 refills | Status: DC

## 2017-07-02 MED ORDER — METHOCARBAMOL 500 MG PO TABS: 500.0000 mg | ORAL_TABLET | Freq: Three times a day (TID) | ORAL | 0 refills | Status: DC | PRN

## 2017-07-02 MED FILL — METHOCARBAMOL 500 MG TABLET: 500 | 10 days supply | Qty: 30 | Fill #0

## 2017-07-02 MED FILL — LIDOCAINE PATCH 5%: 5 | 30 days supply | Qty: 30 | Fill #0
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
## 2017-07-02 NOTE — ED Triage Notes (Signed)
Pt c/o bil lower back pain x 2 days

## 2017-07-02 NOTE — ED Notes (Signed)
ED Provider at bedside. 

## 2017-07-02 NOTE — Discharge Instructions (Signed)
Back Pain:  I have prescribed you a topical numbing patch and a muscle relaxer.   Lidoderm patches- these are patches that can be placed over your area or discomfort to help soothe the pain. You may use once patch per area daily. If these are too expensive ask the pharmacist about the over the counter version as these are similar.   Robaxin is the muscle relaxer I have prescribed, this is meant to help with muscle tightness. Be aware that this medication may make you drowsy therefore the first time you take this it should be at a time you are in an environment where you can rest. Do not drive or operate heavy machinery when taking this medication.   In addition you may also take Tylenol. Tylenol is generally safe, though you should not take more than 8 of the extra strength (500mg ) pills a day.  We have prescribed you new medication(s) today. Discuss the medications prescribed today with your pharmacist as they can have adverse effects and interactions with your other medicines including over the counter and prescribed medications. Seek medical evaluation if you start to experience new or abnormal symptoms after taking one of these medicines, seek care immediately if you start to experience difficulty breathing, feeling of your throat closing, facial swelling, or rash as these could be indications of a more serious allergic reaction   The application of heat can help soothe the pain.  Maintaining your daily activities, including walking, is encourged, as it will help you get better faster than just staying in bed.  Low back pain is discomfort in the lower back that may be due to injuries to muscles and ligaments around the spine.  Occasionally, it may be caused by a a problem to a part of the spine called a disc.  The pain may last several days or a few weeks.   Your pain should get better over the next 2 weeks.  You will need to follow up with  Your primary healthcare provider in 1-2 weeks for  reassessment, if you do not have a primary care provider one is provided in your discharge instructions. However if you develop severe or worsening pain, low back pain with fever, numbness, weakness, loss of bowel or bladder control, or inability to walk or urinate, inability to keep fluids down, you should return to the ER immediately.  Please follow up with your doctor this week for a recheck if still having symptoms.  Additionally your urine had sugar and a small amount of blood in it- please have this rechecked by your primary care provider within 1-2 weeks as well to ensure improvement.

## 2018-03-16 ENCOUNTER — Ambulatory Visit (INDEPENDENT_AMBULATORY_CARE_PROVIDER_SITE_OTHER): Payer: No Typology Code available for payment source | Admitting: Family Medicine

## 2018-03-16 ENCOUNTER — Encounter (INDEPENDENT_AMBULATORY_CARE_PROVIDER_SITE_OTHER): Payer: Self-pay

## 2018-03-16 ENCOUNTER — Encounter: Payer: Self-pay | Admitting: Family Medicine

## 2018-03-16 VITALS — BP 142/110 | HR 70 | Temp 98.8°F | Ht 71.5 in | Wt 295.5 lb

## 2018-03-16 DIAGNOSIS — I152 Hypertension secondary to endocrine disorders: Secondary | ICD-10-CM | POA: Insufficient documentation

## 2018-03-16 DIAGNOSIS — M25561 Pain in right knee: Secondary | ICD-10-CM

## 2018-03-16 DIAGNOSIS — E1159 Type 2 diabetes mellitus with other circulatory complications: Secondary | ICD-10-CM

## 2018-03-16 DIAGNOSIS — E119 Type 2 diabetes mellitus without complications: Secondary | ICD-10-CM

## 2018-03-16 DIAGNOSIS — I10 Essential (primary) hypertension: Secondary | ICD-10-CM

## 2018-03-16 DIAGNOSIS — G8929 Other chronic pain: Secondary | ICD-10-CM

## 2018-03-16 DIAGNOSIS — M25562 Pain in left knee: Secondary | ICD-10-CM

## 2018-03-16 LAB — LIPID PANEL
CHOLESTEROL: 193 mg/dL (ref 0–200)
HDL: 40.1 mg/dL (ref >39.00)
LDL Cholesterol: 125 mg/dL — ABNORMAL HIGH (ref 0–99)
NonHDL: 153.13
Total CHOL/HDL Ratio: 5
Triglycerides: 141 mg/dL (ref 0.0–149.0)
VLDL: 28.2 mg/dL (ref 0.0–40.0)

## 2018-03-16 LAB — COMPREHENSIVE METABOLIC PANEL
ALBUMIN: 4.4 g/dL (ref 3.5–5.2)
ALK PHOS: 50 U/L (ref 39–117)
ALT: 29 U/L (ref 0–53)
AST: 22 U/L (ref 0–37)
BILIRUBIN TOTAL: 0.5 mg/dL (ref 0.2–1.2)
BUN: 16 mg/dL (ref 6–23)
CALCIUM: 9.9 mg/dL (ref 8.4–10.5)
CO2: 28 mEq/L (ref 19–32)
CREATININE: 1.04 mg/dL (ref 0.40–1.50)
Chloride: 102 mEq/L (ref 96–112)
GFR: 106.17 mL/min (ref >60.00)
Glucose, Bld: 237 mg/dL — ABNORMAL HIGH (ref 70–99)
Potassium: 4.5 mEq/L (ref 3.5–5.1)
Sodium: 137 mEq/L (ref 135–145)
TOTAL PROTEIN: 7.5 g/dL (ref 6.0–8.3)

## 2018-03-16 LAB — CBC
HEMATOCRIT: 46.3 % (ref 39.0–52.0)
HEMOGLOBIN: 15.7 g/dL (ref 13.0–17.0)
MCHC: 33.9 g/dL (ref 30.0–36.0)
MCV: 85.4 fl (ref 78.0–100.0)
PLATELETS: 461 10*3/uL — AB (ref 150.0–400.0)
RBC: 5.42 Mil/uL (ref 4.22–5.81)
RDW: 13.9 % (ref 11.5–15.5)
WBC: 6.4 10*3/uL (ref 4.0–10.5)

## 2018-03-16 LAB — MICROALBUMIN / CREATININE URINE RATIO
Creatinine,U: 171 mg/dL
Microalb Creat Ratio: 2.2 mg/g (ref 0.0–30.0)
Microalb, Ur: 3.7 mg/dL — ABNORMAL HIGH (ref 0.0–1.9)

## 2018-03-16 LAB — POCT GLYCOSYLATED HEMOGLOBIN (HGB A1C): HEMOGLOBIN A1C: 8.9 % — AB (ref 4.0–5.6)

## 2018-03-16 MED ORDER — LISINOPRIL 10 MG PO TABS: 10.0000 mg | ORAL_TABLET | Freq: Every day | ORAL | 2 refills | Status: DC

## 2018-03-16 MED FILL — LISINOPRIL 10 MG TABS: 10 | 30 days supply | Qty: 30 | Fill #0

## 2018-03-16 NOTE — Assessment & Plan Note (Signed)
BP elevated today. Will start lisinopril given Diabetes. Return in 4 weeks. Repeat labs for Creatinine check then.

## 2018-03-16 NOTE — Assessment & Plan Note (Signed)
Hgb A1c 8.9 which is better than previously. Increase lantus to 15 and decrease novolog to see if we can get better control. Endo referral and nutrition referral. Return in 4 weeks. Encouraged to get eye exam. Urine collected today. Will need foot exam

## 2018-03-16 NOTE — Assessment & Plan Note (Signed)
Did not have time to complete full exam. Suspect mild arthritis vs Muscle related. PT exercises given for shin splints and for knee strengthening. Consider referral if still bad with home PT.
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
## 2018-03-16 NOTE — Patient Instructions (Signed)
Increase the Lantus to 15 units at night  Decrease the Novolog to 15 units  See endocrinology  Get labs today

## 2018-03-17 ENCOUNTER — Telehealth: Payer: Self-pay | Admitting: Family Medicine

## 2018-03-17 DIAGNOSIS — E785 Hyperlipidemia, unspecified: Principal | ICD-10-CM

## 2018-03-17 DIAGNOSIS — E1169 Type 2 diabetes mellitus with other specified complication: Secondary | ICD-10-CM

## 2018-03-17 NOTE — Telephone Encounter (Signed)
Called to discuss lab results.   CMP normal CBC - platelets a little high but stable from prior  Lipids with LDL 125, goal <100.   Discussed diet and exercise to try and lower vs starting medication and patient would like to try lifestyle treatment first.   Repeat Lipids in 6 months.

## 2018-04-03 ENCOUNTER — Encounter: Payer: Self-pay | Admitting: *Deleted

## 2018-04-03 ENCOUNTER — Encounter: Payer: No Typology Code available for payment source | Attending: Family Medicine | Admitting: *Deleted

## 2018-04-03 VITALS — BP 136/90 | Ht 75.0 in | Wt 299.4 lb

## 2018-04-03 DIAGNOSIS — E119 Type 2 diabetes mellitus without complications: Secondary | ICD-10-CM

## 2018-04-03 DIAGNOSIS — Z794 Long term (current) use of insulin: Principal | ICD-10-CM
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
## 2018-04-10 ENCOUNTER — Ambulatory Visit: Payer: No Typology Code available for payment source | Admitting: *Deleted

## 2018-04-13 ENCOUNTER — Telehealth: Payer: Self-pay | Admitting: *Deleted

## 2018-04-13 NOTE — Telephone Encounter (Signed)
Pt didn't show for appointment on Friday Feb 7. I called him and he reports he forgot the date and thought it was this Friday. Scheduled appt for 2/14 to complete initial assessment. He left during the first visit due to an emergency at home.

## 2018-04-17 ENCOUNTER — Ambulatory Visit: Payer: No Typology Code available for payment source | Admitting: *Deleted

## 2018-04-27 ENCOUNTER — Encounter: Payer: Self-pay | Admitting: *Deleted
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
## 2018-04-29 ENCOUNTER — Telehealth: Payer: Self-pay

## 2018-04-29 NOTE — Telephone Encounter (Signed)
Patient called and made an appointment

## 2018-04-29 NOTE — Telephone Encounter (Signed)
Left message for patient to call back. Need a follow up appointment with Dr. Einar Pheasant anytime now.

## 2018-04-30 ENCOUNTER — Ambulatory Visit (INDEPENDENT_AMBULATORY_CARE_PROVIDER_SITE_OTHER): Payer: No Typology Code available for payment source | Admitting: Family Medicine

## 2018-04-30 ENCOUNTER — Encounter: Payer: Self-pay | Admitting: Family Medicine

## 2018-04-30 VITALS — BP 156/106 | HR 64 | Temp 98.5°F | Ht 71.5 in | Wt 306.5 lb

## 2018-04-30 DIAGNOSIS — E119 Type 2 diabetes mellitus without complications: Secondary | ICD-10-CM

## 2018-04-30 DIAGNOSIS — Z23 Encounter for immunization: Secondary | ICD-10-CM | POA: Diagnosis not present

## 2018-04-30 DIAGNOSIS — I10 Essential (primary) hypertension: Secondary | ICD-10-CM

## 2018-04-30 DIAGNOSIS — Z6841 Body Mass Index (BMI) 40.0 and over, adult: Secondary | ICD-10-CM

## 2018-04-30 DIAGNOSIS — G4730 Sleep apnea, unspecified: Secondary | ICD-10-CM

## 2018-04-30 DIAGNOSIS — E1159 Type 2 diabetes mellitus with other circulatory complications: Secondary | ICD-10-CM | POA: Diagnosis not present

## 2018-04-30 DIAGNOSIS — I152 Hypertension secondary to endocrine disorders: Secondary | ICD-10-CM

## 2018-04-30 MED ORDER — HYDROCHLOROTHIAZIDE 12.5 MG PO CAPS: 12.5000 mg | ORAL_CAPSULE | Freq: Every day | ORAL | 1 refills | Status: DC

## 2018-04-30 NOTE — Assessment & Plan Note (Signed)
Based on fatigue suspect Sleep apnea. [place] Sleepiness scale was elevated. Neck circumference >16 inches. Referral placed.

## 2018-04-30 NOTE — Patient Instructions (Addendum)
1. Fatigue - I think this may be sleep apnea, I placed a referral    2. Hypertension: Start the hydrochlorothiazide 12.5 mg daily -- come in sooner if yo have side effects   Your blood pressure high.   High blood pressure increases your risk for heart attack and stroke.    Please check your blood pressure 2-4 times a week.   To check your blood pressure 1) Sit in a quiet and relaxed place for 5 minutes 2) Make sure your feet are flat on the ground 3) Consider checking first thing in the morning   Normal blood pressure is less than 140/90 Ideally you blood pressure should be around 120/80  Other ways you can reduce your blood pressure:  1) Regular exercise -- Try to get 150 minutes (30 minutes, 5 days a week) of moderate to vigorous aerobic excercise -- Examples: brisk walking (2.5 miles per hour), water aerobics, dancing, gardening, tennis, biking slower than 10 miles per hour 2) DASH Diet - low fat meats, more fresh fruits and vegetables, whole grains, low salt 3) Quit smoking if you smoke 4) Loose 5-10% of your body weight   DASH Eating Plan DASH stands for "Dietary Approaches to Stop Hypertension." The DASH eating plan is a healthy eating plan that has been shown to reduce high blood pressure (hypertension). It may also reduce your risk for type 2 diabetes, heart disease, and stroke. The DASH eating plan may also help with weight loss. What are tips for following this plan?  General guidelines  Avoid eating more than 2,300 mg (milligrams) of salt (sodium) a day. If you have hypertension, you may need to reduce your sodium intake to 1,500 mg a day.  Limit alcohol intake to no more than 1 drink a day for nonpregnant women and 2 drinks a day for men. One drink equals 12 oz of beer, 5 oz of wine, or 1 oz of hard liquor.  Work with your health care provider to maintain a healthy body weight or to lose weight. Ask what an ideal weight is for you.  Get at least 30 minutes of  exercise that causes your heart to beat faster (aerobic exercise) most days of the week. Activities may include walking, swimming, or biking.  Work with your health care provider or diet and nutrition specialist (dietitian) to adjust your eating plan to your individual calorie needs. Reading food labels   Check food labels for the amount of sodium per serving. Choose foods with less than 5 percent of the Daily Value of sodium. Generally, foods with less than 300 mg of sodium per serving fit into this eating plan.  To find whole grains, look for the word "whole" as the first word in the ingredient list. Shopping  Buy products labeled as "low-sodium" or "no salt added."  Buy fresh foods. Avoid canned foods and premade or frozen meals. Cooking  Avoid adding salt when cooking. Use salt-free seasonings or herbs instead of table salt or sea salt. Check with your health care provider or pharmacist before using salt substitutes.  Do not fry foods. Cook foods using healthy methods such as baking, boiling, grilling, and broiling instead.  Cook with heart-healthy oils, such as olive, canola, soybean, or sunflower oil. Meal planning  Eat a balanced diet that includes: ? 5 or more servings of fruits and vegetables each day. At each meal, try to fill half of your plate with fruits and vegetables. ? Up to 6-8 servings of whole grains  each day. ? Less than 6 oz of lean meat, poultry, or fish each day. A 3-oz serving of meat is about the same size as a deck of cards. One egg equals 1 oz. ? 2 servings of low-fat dairy each day. ? A serving of nuts, seeds, or beans 5 times each week. ? Heart-healthy fats. Healthy fats called Omega-3 fatty acids are found in foods such as flaxseeds and coldwater fish, like sardines, salmon, and mackerel.  Limit how much you eat of the following: ? Canned or prepackaged foods. ? Food that is high in trans fat, such as fried foods. ? Food that is high in saturated fat,  such as fatty meat. ? Sweets, desserts, sugary drinks, and other foods with added sugar. ? Full-fat dairy products.  Do not salt foods before eating.  Try to eat at least 2 vegetarian meals each week.  Eat more home-cooked food and less restaurant, buffet, and fast food.  When eating at a restaurant, ask that your food be prepared with less salt or no salt, if possible. What foods are recommended? The items listed may not be a complete list. Talk with your dietitian about what dietary choices are best for you. Grains Whole-grain or whole-wheat bread. Whole-grain or whole-wheat pasta. Brown rice. [place]. Bulgur. Whole-grain and low-sodium cereals. Pita bread. Low-fat, low-sodium crackers. Whole-wheat flour tortillas. Vegetables Fresh or frozen vegetables (raw, steamed, roasted, or grilled). Low-sodium or reduced-sodium tomato and vegetable juice. Low-sodium or reduced-sodium tomato sauce and tomato paste. Low-sodium or reduced-sodium canned vegetables. Fruits All fresh, dried, or frozen fruit. Canned fruit in natural juice (without added sugar). Meat and other protein foods Skinless chicken or [place]. Ground chicken or [place]. Pork with fat trimmed off. Fish and seafood. Egg whites. Dried beans, peas, or lentils. Unsalted nuts, nut butters, and seeds. Unsalted canned beans. Lean cuts of beef with fat trimmed off. Low-sodium, lean deli meat. Dairy Low-fat (1%) or fat-free (skim) milk. Fat-free, low-fat, or reduced-fat cheeses. Nonfat, low-sodium ricotta or cottage cheese. Low-fat or nonfat yogurt. Low-fat, low-sodium cheese. Fats and oils Soft margarine without trans fats. Vegetable oil. Low-fat, reduced-fat, or light mayonnaise and salad dressings (reduced-sodium). Canola, safflower, olive, soybean, and sunflower oils. Avocado. Seasoning and other foods Herbs. Spices. Seasoning mixes without salt. Unsalted popcorn and pretzels. Fat-free sweets. What foods are not recommended? The  items listed may not be a complete list. Talk with your dietitian about what dietary choices are best for you. Grains Baked goods made with fat, such as croissants, muffins, or some breads. Dry pasta or rice meal packs. Vegetables Creamed or fried vegetables. Vegetables in a cheese sauce. Regular canned vegetables (not low-sodium or reduced-sodium). Regular canned tomato sauce and paste (not low-sodium or reduced-sodium). Regular tomato and vegetable juice (not low-sodium or reduced-sodium). Angie Fava. Olives. Fruits Canned fruit in a light or heavy syrup. Fried fruit. Fruit in cream or butter sauce. Meat and other protein foods Fatty cuts of meat. Ribs. Fried meat. Berniece Salines. Sausage. Bologna and other processed lunch meats. Salami. Fatback. Hotdogs. Bratwurst. Salted nuts and seeds. Canned beans with added salt. Canned or smoked fish. Whole eggs or egg yolks. Chicken or [place] with skin. Dairy Whole or 2% milk, cream, and half-and-half. Whole or full-fat cream cheese. Whole-fat or sweetened yogurt. Full-fat cheese. Nondairy creamers. Whipped toppings. Processed cheese and cheese spreads. Fats and oils Butter. Stick margarine. Lard. Shortening. Ghee. Bacon fat. Tropical oils, such as coconut, palm kernel, or palm oil. Seasoning and other foods Salted popcorn and  pretzels. Onion salt, garlic salt, seasoned salt, table salt, and sea salt. Worcestershire sauce. Tartar sauce. Barbecue sauce. Teriyaki sauce. Soy sauce, including reduced-sodium. Steak sauce. Canned and packaged gravies. Fish sauce. Oyster sauce. Cocktail sauce. Horseradish that you find on the shelf. Ketchup. Mustard. Meat flavorings and tenderizers. Bouillon cubes. Hot sauce and Tabasco sauce. Premade or packaged marinades. Premade or packaged taco seasonings. Relishes. Regular salad dressings. Where to find more information:  National Heart, Lung, and [place]: https://wilson-eaton.com/  American Heart Association:  www.heart.org Summary  The DASH eating plan is a healthy eating plan that has been shown to reduce high blood pressure (hypertension). It may also reduce your risk for type 2 diabetes, heart disease, and stroke.  With the DASH eating plan, you should limit salt (sodium) intake to 2,300 mg a day. If you have hypertension, you may need to reduce your sodium intake to 1,500 mg a day.  When on the DASH eating plan, aim to eat more fresh fruits and vegetables, whole grains, lean proteins, low-fat dairy, and heart-healthy fats.  Work with your health care provider or diet and nutrition specialist (dietitian) to adjust your eating plan to your individual calorie needs. This information is not intended to replace advice given to you by your health care provider. Make sure you discuss any questions you have with your health care provider. Document Released: 02/07/2011 Document Revised: 02/12/2016 Document Reviewed: 02/12/2016 Elsevier Interactive Patient Education  2019 [employer].

## 2018-04-30 NOTE — Assessment & Plan Note (Signed)
Briefly asked about medication for weight loss. Discussed that working to lower bp before taking medications due to risk factors/side effects.

## 2018-04-30 NOTE — Assessment & Plan Note (Signed)
Already doing exercise. Encouraged and given DASH diet. Start HCTZ low dose. Return if side effects

## 2018-04-30 NOTE — Assessment & Plan Note (Signed)
Seeing endocrine and HgbA1c 8.9. Tries to avoid sugar.

## 2018-04-30 NOTE — Progress Notes (Signed)
Subjective:     Nicholas [name] is a 33 y.o. male presenting for Hypertension (follow up. Not checking his b/p. Stopped Lisinopril about a week ago due to he was having dizziness, vomiting, stomach cramps.) and Fatigue (would like to discuss getting B12 shots maybe. Labs?)     HPI   #HTN - not checking BP - stopped lisinopril due to dizziness, vomiting, stomach cramps - stomach cramps and dizziness were daily - took the medication for about 1 week before stopping -   #Diabetes - drinks a gallon of water daily - checking sugar at home and 100-185 most days - skips breakfast - only eats lunch/dinner - salad at lunch, steak/chicken for dinner - tries to limit carbs and sugar -- avoids sugary beverages - on the free style meter - has an appointment in April for A1c - unable to do nutrition 2/2 to schedule  #Fatigue - difficulty energy throughout the day - working out helps with energy  - when he doesn't work out will be tired - tries to avoid caffeine -- less headaches - snores - has not been told he stops  - tiredness, sleepiness fatigue  #depressed mood - happy - loves his job - pays the bills - gets along with family - has a son who keeps him busy - 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  Respiratory: Negative for shortness of breath.   Cardiovascular: Negative for chest pain and palpitations.  Gastrointestinal: Negative for abdominal pain.  Endocrine: Negative for polydipsia and polyuria.  Neurological: Positive for headaches. Negative for dizziness.    03/16/2018: Clinic - HTN - started lisinopril, DM - increase lantus to 15 units and referrals. Knee pain - home PT.  - BMP - PHQ-9 follow-up discussion as elevated   Social History   Tobacco Use  Smoking Status Never Smoker  Smokeless Tobacco Never Used        Objective:    BP Readings from Last 3 Encounters:  04/30/18 (!) 156/106  04/03/18  136/90  03/16/18 (!) 142/110   Wt Readings from Last 3 Encounters:  04/30/18 (!) 306 lb 8 oz (139 kg)  04/03/18 299 lb 6.4 oz (135.8 kg)  03/16/18 295 lb 8 oz (134 kg)    BP (!) 156/106   Pulse 64   Temp 98.5 F (36.9 C)   Ht 5' 11.5" (1.816 m)   Wt (!) 306 lb 8 oz (139 kg)   BMI 42.15 kg/m    Physical Exam Constitutional:      Appearance: Normal appearance. He is not ill-appearing or diaphoretic.  HENT:     Right Ear: External ear normal.     Left Ear: External ear normal.     Nose: Nose normal.  Eyes:     General: No scleral icterus.    Extraocular Movements: Extraocular movements intact.     Conjunctiva/sclera: Conjunctivae normal.  Neck:     Musculoskeletal: Neck supple.  Cardiovascular:     Rate and Rhythm: Normal rate and regular rhythm.     Heart sounds: No murmur.  Pulmonary:     Effort: Pulmonary effort is normal. No respiratory distress.     Breath sounds: Normal breath sounds. No wheezing or rales.  Musculoskeletal:     Comments: Right ankle with large scar on bilateral sides  Skin:    General: Skin is warm and dry.  Neurological:     Mental Status: He is alert. Mental status  is at baseline.  Psychiatric:        Mood and Affect: Mood normal.     Diabetic Foot Exam - Simple   Simple Foot Form Visual Inspection No deformities, no ulcerations, no other skin breakdown bilaterally:  Yes Sensation Testing Intact to touch and monofilament testing bilaterally:  Yes Pulse Check Posterior Tibialis and Dorsalis pulse intact bilaterally:  Yes Comments           Assessment & Plan:   Problem List Items Addressed This Visit      Cardiovascular and Mediastinum   Hypertension associated with diabetes ([place]) - Primary    Already doing exercise. Encouraged and given DASH diet. Start HCTZ low dose. Return if side effects      Relevant Medications   hydrochlorothiazide (MICROZIDE) 12.5 MG capsule     Respiratory   Sleep apnea    Based on fatigue  suspect Sleep apnea. Epiworth Sleepiness scale was elevated. Neck circumference >16 inches. Referral placed.       Relevant Orders   Ambulatory referral to Pulmonology     Endocrine   Diabetes mellitus without complication ([place])    Seeing endocrine and HgbA1c 8.9. Tries to avoid sugar.       Relevant Orders   Pneumococcal polysaccharide vaccine 23-valent greater than or equal to 2yo subcutaneous/IM (Completed)     Other   Morbid obesity with BMI of 40.0-44.9, adult ([place])    Briefly asked about medication for weight loss. Discussed that working to lower bp before taking medications due to risk factors/side effects.           Return in about 4 weeks (around 05/28/2018).  Lesleigh Noe, MD

## 2018-05-13 ENCOUNTER — Ambulatory Visit (INDEPENDENT_AMBULATORY_CARE_PROVIDER_SITE_OTHER): Payer: No Typology Code available for payment source | Admitting: Internal Medicine

## 2018-05-13 ENCOUNTER — Encounter: Payer: Self-pay | Admitting: Internal Medicine

## 2018-05-13 ENCOUNTER — Other Ambulatory Visit: Payer: Self-pay

## 2018-05-13 VITALS — BP 140/90 | HR 89 | Ht 71.5 in | Wt 307.0 lb

## 2018-05-13 DIAGNOSIS — G4719 Other hypersomnia: Secondary | ICD-10-CM

## 2018-05-13 NOTE — Patient Instructions (Signed)
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
## 2018-05-28 ENCOUNTER — Ambulatory Visit (INDEPENDENT_AMBULATORY_CARE_PROVIDER_SITE_OTHER): Payer: No Typology Code available for payment source | Admitting: Family Medicine

## 2018-05-28 ENCOUNTER — Encounter: Payer: Self-pay | Admitting: Family Medicine

## 2018-05-28 ENCOUNTER — Other Ambulatory Visit: Payer: Self-pay

## 2018-05-28 DIAGNOSIS — I152 Hypertension secondary to endocrine disorders: Secondary | ICD-10-CM

## 2018-05-28 DIAGNOSIS — E1159 Type 2 diabetes mellitus with other circulatory complications: Secondary | ICD-10-CM | POA: Diagnosis not present

## 2018-05-28 DIAGNOSIS — I10 Essential (primary) hypertension: Secondary | ICD-10-CM

## 2018-05-28 MED ORDER — HYDROCHLOROTHIAZIDE 25 MG PO TABS: 25.0000 mg | ORAL_TABLET | Freq: Every day | ORAL | 3 refills | Status: DC

## 2018-05-28 NOTE — Assessment & Plan Note (Signed)
Not checking BP at home, but BP improved from 2/27>3/11 and pt endorses feeling better overall. Encouraged getting home BP cuff or checking at work, [employer] in 1-2 weeks with home monitoring. Return for lab appointment to get BMP. Increase HCTZ 12.5>25 mg

## 2018-05-28 NOTE — Progress Notes (Signed)
Virtual Visit via Telephone Note  I connected with Nicholas [name] on 05/28/18 at  8:00 AM EDT by telephone and verified that I am speaking with the correct person using two identifiers.   I discussed the limitations, risks, security and privacy concerns of performing an evaluation and management service by telephone and the availability of in person appointments. I also discussed with the patient that there may be a patient responsible charge related to this service. The patient expressed understanding and agreed to proceed.  Patient location: Home Provider Location: [facility] Participants: Lesleigh Noe and Reinaldo Raddle   History of Present Illness: #HTN - has not been checking his BP at home - but is no longer feeling lightheaded with high pressures - normally feels a little high when pressure is high - BP on 05/13/2018 was 140/90 - no cp, sob - feeling more normal throughout the day   04/30/2018: Clinic - HTN, DM   Observations/Objective: Calm, pleasant  BP Readings from Last 3 Encounters:  05/13/18 140/90  04/30/18 (!) 156/106  04/03/18 136/90     Assessment and Plan: Problem List Items Addressed This Visit      Cardiovascular and Mediastinum   Hypertension associated with diabetes ([place]) - Primary    Not checking BP at home, but BP improved from 2/27>3/11 and pt endorses feeling better overall. Encouraged getting home BP cuff or checking at work, [employer] in 1-2 weeks with home monitoring. Return for lab appointment to get BMP. Increase HCTZ 12.5>25 mg      Relevant Medications   hydrochlorothiazide (HYDRODIURIL) 25 MG tablet   Other Relevant Orders   Basic metabolic panel       Follow Up Instructions: Mychart check in in 1-2 weeks with home BP     I discussed the assessment and treatment plan with the patient. The patient was provided an opportunity to ask questions and all were answered. The patient agreed with the plan and  demonstrated an understanding of the instructions.   The patient was advised to call back or seek an in-person evaluation if the symptoms worsen or if the condition fails to improve as anticipated.  I provided 9 minutes of non-face-to-face time during this encounter.   Lesleigh Noe, MD

## 2018-06-05 ENCOUNTER — Other Ambulatory Visit (INDEPENDENT_AMBULATORY_CARE_PROVIDER_SITE_OTHER): Payer: No Typology Code available for payment source

## 2018-06-05 ENCOUNTER — Other Ambulatory Visit: Payer: Self-pay

## 2018-06-05 ENCOUNTER — Encounter: Payer: Self-pay | Admitting: Family Medicine

## 2018-06-05 DIAGNOSIS — E1159 Type 2 diabetes mellitus with other circulatory complications: Secondary | ICD-10-CM | POA: Diagnosis not present

## 2018-06-05 DIAGNOSIS — I152 Hypertension secondary to endocrine disorders: Secondary | ICD-10-CM

## 2018-06-05 DIAGNOSIS — I10 Essential (primary) hypertension: Secondary | ICD-10-CM

## 2018-06-05 LAB — BASIC METABOLIC PANEL
BUN: 15 mg/dL (ref 6–23)
CO2: 28 mEq/L (ref 19–32)
Calcium: 9.2 mg/dL (ref 8.4–10.5)
Chloride: 102 mEq/L (ref 96–112)
Creatinine, Ser: 1.06 mg/dL (ref 0.40–1.50)
GFR: 97.59 mL/min (ref >60.00)
Glucose, Bld: 201 mg/dL — ABNORMAL HIGH (ref 70–99)
Potassium: 4 mEq/L (ref 3.5–5.1)
Sodium: 139 mEq/L (ref 135–145)

## 2018-06-23 ENCOUNTER — Other Ambulatory Visit: Payer: Self-pay

## 2018-06-23 ENCOUNTER — Ambulatory Visit (INDEPENDENT_AMBULATORY_CARE_PROVIDER_SITE_OTHER): Payer: No Typology Code available for payment source | Admitting: Internal Medicine

## 2018-06-23 DIAGNOSIS — J4540 Moderate persistent asthma, uncomplicated: Secondary | ICD-10-CM | POA: Diagnosis not present

## 2018-06-23 DIAGNOSIS — D869 Sarcoidosis, unspecified: Secondary | ICD-10-CM

## 2018-06-23 MED ORDER — FLUTICASONE FUROATE-VILANTEROL 200-25 MCG/INH IN AEPB: 1.0000 | INHALATION_SPRAY | Freq: Every day | RESPIRATORY_TRACT | 5 refills | Status: DC

## 2018-06-23 NOTE — Patient Instructions (Signed)
We will send for blood work which includes a CBC and ACE level, this is different than blood work that was ordered by her primary care physician previously. We will start you on Breo inhaler 1 puff once daily.  Rinse mouth after use. We will follow-up with you in approximately 3 months.

## 2018-06-23 NOTE — Addendum Note (Signed)
Addended by: Darreld Mclean on: 06/23/2018 10:50 AM   Modules accepted: Orders

## 2018-06-23 NOTE — Progress Notes (Signed)
[facility] Consultation     Virtual Visit via Telephone Note I connected with pt on 06/23/18 at 10:15 AM EDT by telephone and verified that I am speaking with the correct person using two identifiers.   I discussed the limitations, risks, security and privacy concerns of performing an evaluation and management service by telephone and the availability of in person appointments. I also discussed with the patient that there may be a patient responsible charge related to this service. The patient expressed understanding and agreed to proceed. I discussed the assessment and treatment plan with the patient. The patient was provided an opportunity to ask questions and all were answered. The patient agreed with the plan and demonstrated an understanding of the instructions. Please see note below for further detail.    The patient was advised to call back or seek an in-person evaluation if the symptoms worsen or if the condition fails to improve as anticipated.  I provided 11 minutes of non-face-to-face time during this encounter.   Laverle Hobby, MD   Assessment and Plan:  Excessive daytime sleepiness. - Symptoms and signs of obstructive sleep apnea. - We will send for sleep study.  Sleep study is currently pending due to the current COVID 19 crisis.  Cough. - Cough is been present for 10+ years.   - We will check CBC with differential, will check ACE level. -Will start Breo inhaler empirically for possible asthma.   GERD, essential hypertension. - Above conditions can be contributed to by obstructive sleep apnea, therefore treatment of sleep apnea is important part of their management.  Orders Placed This Encounter  Procedures  . Angiotensin converting enzyme  . CBC with Differential/Platelet   Return in about 5 weeks (around 07/28/2018)..  Date: 06/23/2018  MRN# [id number] Nicholas [name] March 10, 1985    Nicholas [name] is a 33 y.o. old male seen in  consultation for chief complaint of:    No chief complaint on file.   HPI:   Patient is a 33 year old male presents with symptoms of daytime sleepiness.  He notes that he goes to bed between 9 PM and 11 PM, wakes up at 6:30 AM, wakes up feeling tired.  Epworth is elevated at 16 today.  BMI is elevated at 42. Sometimes when he wakes he is short of breath. Has trouble with long drives or long meetings, he will tend to get sleepy. He will nap on the weekends.  Has had sleep paralysis, denies sleep walking, denies cataplexy, denies jaw pain, denies TMJ, no dentures.   Since his last visit he notes that the cough is still present, he has had the cough for 12 to 13  Years, but lately he has been having spasms of cough. He has been on prilosec which did not help, though he coughs after he eats or going up stairs or exercise. He does not take any inhalers.  He does have nasal drainage.  He has no pets. His lisinopril was changed to diuretic about 3 weeks ago, no improvement in the cough since then.   He has had a dry cough for years, it was thought to be due to reflux, but he denies symptoms. He was started on lisinopril only within the past few weeks and the cough predated that.  He denies sinus drainage.   **Chest x-ray 06/19/2017>> imaging personally reviewed, normal lungs, slightly increased infiltrate in the right middle lobe.  Medication:    Current Outpatient Medications:  .  Continuous Blood Gluc Receiver (FREESTYLE  LIBRE 14 DAY READER) DEVI, Use 1 each 3 (three) times daily as needed, Disp: , Rfl:  .  Continuous Blood Gluc Receiver (FREESTYLE LIBRE 14 DAY READER) DEVI, USE 1 EACH 3 (THREE) TIMES DAILY AS NEEDED, Disp: , Rfl:  .  Continuous Blood Gluc Sensor (FREESTYLE LIBRE 14 DAY SENSOR) MISC, Use 1 each every 14 (fourteen) days, Disp: , Rfl:  .  hydrochlorothiazide (HYDRODIURIL) 25 MG tablet, Take 1 tablet (25 mg total) by mouth daily., Disp: 90 tablet, Rfl: 3 .  insulin aspart (NOVOLOG)  100 UNIT/ML injection, Inject 14 Units into the skin 3 (three) times daily with meals. (Patient taking differently: Inject 20 Units into the skin 3 (three) times daily with meals. ), Disp: 20 mL, Rfl: 1 .  insulin glargine (LANTUS) 100 UNIT/ML injection, Inject 0.1 mLs (10 Units total) into the skin at bedtime., Disp: 10 mL, Rfl: 11 .  Turmeric, Curcuma Longa, (CURCUMIN EXTRACT) POWD, 1 tablet by Does not apply route daily., Disp: , Rfl:    Allergies:  Ibuprofen  Review of Systems: Gen:  Denies  fever, sweats, chills HEENT: Denies blurred vision, double vision. bleeds, sore throat Cvc:  No dizziness, chest pain. Resp:   Denies cough or sputum production, shortness of breath Gi: Denies swallowing difficulty, stomach pain. Gu:  Denies bladder incontinence, burning urine Ext:   No Joint pain, stiffness. Skin: No skin rash,  hives  Endoc:  No polyuria, polydipsia. Psych: No depression, insomnia. Other:  All other systems were reviewed with the patient and were negative other that what is mentioned in the HPI.       LABORATORY PANEL:   CBC No results for input(s): WBC, HGB, HCT, PLT in the last 168 hours. ------------------------------------------------------------------------------------------------------------------  Chemistries  No results for input(s): NA, K, CL, CO2, GLUCOSE, BUN, CREATININE, CALCIUM, MG, AST, ALT, ALKPHOS, BILITOT in the last 168 hours.  Invalid input(s): GFRCGP ------------------------------------------------------------------------------------------------------------------  Cardiac Enzymes No results for input(s): TROPONINI in the last 168 hours. ------------------------------------------------------------  RADIOLOGY:  No results found.     Thank  you for the consultation and for allowing [facility] to assist in the care of your patient. Our recommendations are noted above.  Please contact us if we can be of further service.    Marda Stalker, M.D., F.C.C.P.  Board Certified in Internal Medicine, Pulmonary Medicine, [place], and Sleep Medicine.  [facility] Office Number: [phone]   06/23/2018

## 2018-07-03 ENCOUNTER — Ambulatory Visit: Payer: No Typology Code available for payment source

## 2018-07-03 ENCOUNTER — Other Ambulatory Visit: Payer: Self-pay

## 2018-07-09 ENCOUNTER — Telehealth: Payer: Self-pay | Admitting: Internal Medicine

## 2018-07-09 NOTE — Telephone Encounter (Signed)
Will route to [place] to schedule.

## 2018-07-09 NOTE — Telephone Encounter (Signed)
Pt called and R/S to Friday 07/10/2018 at 2:00. Rhonda J Cobb

## 2018-07-10 ENCOUNTER — Other Ambulatory Visit: Payer: Self-pay

## 2018-07-10 ENCOUNTER — Ambulatory Visit: Payer: No Typology Code available for payment source

## 2018-07-13 DIAGNOSIS — G4733 Obstructive sleep apnea (adult) (pediatric): Secondary | ICD-10-CM

## 2018-07-14 DIAGNOSIS — G4733 Obstructive sleep apnea (adult) (pediatric): Secondary | ICD-10-CM | POA: Diagnosis not present

## 2018-07-15 ENCOUNTER — Telehealth: Payer: Self-pay | Admitting: Internal Medicine

## 2018-07-15 DIAGNOSIS — G4733 Obstructive sleep apnea (adult) (pediatric): Secondary | ICD-10-CM

## 2018-07-15 NOTE — Telephone Encounter (Signed)
HST preformed on 07/12/2018 confirmed mild OSA with AHI of 10. However, study is suboptima due to short testing time.  recommend auto cpap 5-20cm h2O and weight loss.  Called and spoke to pt, and relayed above results. Pt stated that the device kept blinking orange during the night, so he kept waking up to be sure that the device was working properly.   Pt wished to proceed with cpap.  Order has been placed.  Pt has pending OV for 09/18/2018. Nothing further is needed.

## 2018-08-20 MED FILL — HYDROCHLOROTHIAZIDE 25 MG T: 25 | 90 days supply | Qty: 90 | Fill #0

## 2018-08-20 MED FILL — HumaLOG 100 UNIT/ML SOLN: 100 | 49 days supply | Qty: 30 | Fill #0

## 2018-08-28 MED FILL — BREO ELLIPTA 200-25 MCG INH: 200-25 | 30 days supply | Qty: 60 | Fill #0

## 2018-09-16 ENCOUNTER — Telehealth: Payer: Self-pay | Admitting: Pulmonary Disease

## 2018-09-16 NOTE — Telephone Encounter (Signed)
Pt is calling back [phone]

## 2018-09-16 NOTE — Telephone Encounter (Signed)
On 07/15/2018 pt was contacted by [employer] and pt advised that he could not proceed at this time with set up due to cost. Catha Gosselin

## 2018-09-16 NOTE — Telephone Encounter (Signed)
Message sent to [facility] with [employer] to check on status of order. Rhonda J Cobb

## 2018-09-16 NOTE — Telephone Encounter (Signed)
Called and spoke to pt, who confirmed that he has not been setup on cpap due to co payment.   Suanne Marker, are there any patient assistance programs for cpap? Does [employer] offer this?

## 2018-09-16 NOTE — Telephone Encounter (Signed)
Contacted [employer] and requested that pt be enrolled in [place].  I was advised that pt has not been setup on cpap as of yet. Order was placed to [employer] on 07/15/2018. Left message for pt regarding this matter. If pt is not having any or worsen sx, we can reschedule visit until after setup.  Suanne Marker, can you help with this. Thanks

## 2018-09-18 ENCOUNTER — Ambulatory Visit: Payer: No Typology Code available for payment source | Admitting: Internal Medicine

## 2018-09-23 MED FILL — OZEMPIC 0.25 OR 0.5 MG/DOSE: 2 | 28 days supply | Qty: 2 | Fill #0

## 2018-09-25 ENCOUNTER — Ambulatory Visit: Payer: No Typology Code available for payment source | Admitting: Internal Medicine

## 2018-10-05 MED FILL — FREESTYLE LIBRE 14 DAY SENS: 28 days supply | Qty: 2 | Fill #0

## 2018-10-05 MED FILL — BREO ELLIPTA 200-25 MCG INH: 200-25 | 30 days supply | Qty: 60 | Fill #1

## 2018-10-05 MED FILL — HumaLOG 100 UNIT/ML SOLN: 100 | 50 days supply | Qty: 30 | Fill #1

## 2018-10-29 ENCOUNTER — Other Ambulatory Visit: Payer: Self-pay

## 2018-10-29 DIAGNOSIS — Z20822 Contact with and (suspected) exposure to covid-19: Secondary | ICD-10-CM

## 2018-10-31 LAB — NOVEL CORONAVIRUS, NAA: SARS-CoV-2, NAA: NOT DETECTED

## 2018-11-02 ENCOUNTER — Other Ambulatory Visit: Payer: Self-pay

## 2018-11-02 ENCOUNTER — Ambulatory Visit (INDEPENDENT_AMBULATORY_CARE_PROVIDER_SITE_OTHER): Payer: No Typology Code available for payment source | Admitting: Pulmonary Disease

## 2018-11-02 ENCOUNTER — Encounter: Payer: Self-pay | Admitting: Pulmonary Disease

## 2018-11-02 VITALS — BP 124/80 | HR 74 | Temp 97.6°F | Ht 75.0 in | Wt 302.6 lb

## 2018-11-02 DIAGNOSIS — J4540 Moderate persistent asthma, uncomplicated: Secondary | ICD-10-CM

## 2018-11-02 MED ORDER — BENZONATATE 200 MG PO CAPS: 200.0000 mg | ORAL_CAPSULE | Freq: Three times a day (TID) | ORAL | 1 refills | Status: AC | PRN

## 2018-11-02 MED FILL — BENZONATATE 200 MG CAPSULE: 200 | 10 days supply | Qty: 30 | Fill #0

## 2018-11-02 NOTE — Patient Instructions (Addendum)
Protracted cough  We will obtain a breathing study Trial with benzonatate  Can continue taking Breo-we will consider de-escalating depending on finding on PFT  We will consider allergy testing depending on how we able to control your symptoms  Continue using the CPAP on a regular basis  Trial with a nasal steroid-Nasonex or Flonase-over-the-counter for 4 to 6 weeks  Will see you in about 4 to 6 weeks
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
## 2018-11-18 MED FILL — BENZONATATE 200 MG CAPSULE: 200 | 10 days supply | Qty: 30 | Fill #0

## 2018-11-18 MED FILL — OZEMPIC 0.25 OR 0.5 MG/DOSE: 2 | 28 days supply | Qty: 2 | Fill #1

## 2018-11-30 MED FILL — HumaLOG 100 UNIT/ML SOLN: 100 | 28 days supply | Qty: 10 | Fill #0

## 2018-12-01 MED FILL — FREESTYLE LIBRE 14 DAY SENS: 28 days supply | Qty: 2 | Fill #1

## 2018-12-19 ENCOUNTER — Other Ambulatory Visit: Payer: Self-pay

## 2018-12-19 DIAGNOSIS — Z20822 Contact with and (suspected) exposure to covid-19: Secondary | ICD-10-CM

## 2018-12-20 LAB — NOVEL CORONAVIRUS, NAA: SARS-CoV-2, NAA: NOT DETECTED

## 2018-12-21 ENCOUNTER — Telehealth: Payer: Self-pay | Admitting: General Practice

## 2018-12-21 NOTE — Telephone Encounter (Signed)
Negative COVID results given. Patient results "NOT Detected." Caller expressed understanding. ° °

## 2018-12-30 ENCOUNTER — Other Ambulatory Visit: Payer: Self-pay

## 2018-12-30 DIAGNOSIS — Z20822 Contact with and (suspected) exposure to covid-19: Secondary | ICD-10-CM

## 2019-01-01 LAB — NOVEL CORONAVIRUS, NAA: SARS-CoV-2, NAA: NOT DETECTED

## 2019-01-06 MED FILL — HumaLOG 100 UNIT/ML SOLN: 100 | 28 days supply | Qty: 10 | Fill #1

## 2019-01-06 MED FILL — HYDROCHLOROTHIAZIDE 25 MG T: 25 | 90 days supply | Qty: 90 | Fill #1

## 2019-01-06 MED FILL — OZEMPIC 0.25 OR 0.5 MG/DOSE: 2 | 28 days supply | Qty: 2 | Fill #2

## 2019-02-11 MED FILL — OZEMPIC 0.25 OR 0.5 MG/DOSE: 2 | 28 days supply | Qty: 2 | Fill #0

## 2019-02-11 MED FILL — HumaLOG 100 UNIT/ML SOLN: 100 | 28 days supply | Qty: 10 | Fill #0

## 2019-04-06 MED FILL — HumaLOG 100 UNIT/ML SOLN: 100 | 28 days supply | Qty: 10 | Fill #1

## 2019-07-16 ENCOUNTER — Ambulatory Visit (INDEPENDENT_AMBULATORY_CARE_PROVIDER_SITE_OTHER): Payer: No Typology Code available for payment source | Admitting: Internal Medicine

## 2019-07-16 ENCOUNTER — Encounter (HOSPITAL_BASED_OUTPATIENT_CLINIC_OR_DEPARTMENT_OTHER): Payer: Self-pay | Admitting: Emergency Medicine

## 2019-07-16 ENCOUNTER — Emergency Department (HOSPITAL_BASED_OUTPATIENT_CLINIC_OR_DEPARTMENT_OTHER)
Admission: EM | Admit: 2019-07-16 | Discharge: 2019-07-16 | Disposition: A | Discharge status: Home / Self Care | Payer: No Typology Code available for payment source | Attending: Emergency Medicine | Admitting: Emergency Medicine

## 2019-07-16 ENCOUNTER — Other Ambulatory Visit: Payer: Self-pay

## 2019-07-16 ENCOUNTER — Emergency Department (HOSPITAL_BASED_OUTPATIENT_CLINIC_OR_DEPARTMENT_OTHER): Payer: No Typology Code available for payment source

## 2019-07-16 VITALS — BP 165/98 | HR 63 | Temp 96.7°F

## 2019-07-16 DIAGNOSIS — G4489 Other headache syndrome: Secondary | ICD-10-CM | POA: Diagnosis not present

## 2019-07-16 DIAGNOSIS — I10 Essential (primary) hypertension: Secondary | ICD-10-CM | POA: Insufficient documentation

## 2019-07-16 DIAGNOSIS — E119 Type 2 diabetes mellitus without complications: Secondary | ICD-10-CM | POA: Insufficient documentation

## 2019-07-16 DIAGNOSIS — Z794 Long term (current) use of insulin: Secondary | ICD-10-CM | POA: Diagnosis not present

## 2019-07-16 DIAGNOSIS — Z886 Allergy status to analgesic agent status: Secondary | ICD-10-CM | POA: Insufficient documentation

## 2019-07-16 DIAGNOSIS — Z79899 Other long term (current) drug therapy: Secondary | ICD-10-CM | POA: Diagnosis not present

## 2019-07-16 DIAGNOSIS — G43909 Migraine, unspecified, not intractable, without status migrainosus: Secondary | ICD-10-CM | POA: Diagnosis not present

## 2019-07-16 DIAGNOSIS — R519 Headache, unspecified: Secondary | ICD-10-CM | POA: Diagnosis present

## 2019-07-16 LAB — CBC WITH DIFFERENTIAL/PLATELET
Abs Immature Granulocytes: 0.04 10*3/uL (ref 0.00–0.07)
Basophils Absolute: 0 10*3/uL (ref 0.0–0.1)
Basophils Relative: 0 %
Eosinophils Absolute: 0 10*3/uL (ref 0.0–0.5)
Eosinophils Relative: 0 %
HCT: 45.3 % (ref 39.0–52.0)
Hemoglobin: 15 g/dL (ref 13.0–17.0)
Immature Granulocytes: 1 %
Lymphocytes Relative: 17 %
Lymphs Abs: 1.4 10*3/uL (ref 0.7–4.0)
MCH: 28.3 pg (ref 26.0–34.0)
MCHC: 33.1 g/dL (ref 30.0–36.0)
MCV: 85.5 fL (ref 80.0–100.0)
Monocytes Absolute: 0.5 10*3/uL (ref 0.1–1.0)
Monocytes Relative: 6 %
Neutro Abs: 5.9 10*3/uL (ref 1.7–7.7)
Neutrophils Relative %: 76 %
Platelets: 409 10*3/uL — ABNORMAL HIGH (ref 150–400)
RBC: 5.3 MIL/uL (ref 4.22–5.81)
RDW: 13.6 % (ref 11.5–15.5)
WBC: 7.9 10*3/uL (ref 4.0–10.5)
nRBC: 0 % (ref 0.0–0.2)

## 2019-07-16 LAB — BASIC METABOLIC PANEL
Anion gap: 9 (ref 5–15)
BUN: 14 mg/dL (ref 6–20)
CO2: 26 mmol/L (ref 22–32)
Calcium: 9 mg/dL (ref 8.9–10.3)
Chloride: 101 mmol/L (ref 98–111)
Creatinine, Ser: 0.98 mg/dL (ref 0.61–1.24)
GFR calc Af Amer: >60 mL/min (ref >60)
GFR calc non Af Amer: >60 mL/min (ref >60)
Glucose, Bld: 238 mg/dL — ABNORMAL HIGH (ref 70–99)
Potassium: 4.6 mmol/L (ref 3.5–5.1)
Sodium: 136 mmol/L (ref 135–145)

## 2019-07-16 MED ORDER — PROCHLORPERAZINE EDISYLATE 10 MG/2ML IJ SOLN
10.0000 mg | Freq: Once | INTRAMUSCULAR | Status: AC
  Administered 2019-07-16: 10 mg via INTRAVENOUS
  Filled 2019-07-16: qty 2

## 2019-07-16 MED ORDER — DIPHENHYDRAMINE HCL 50 MG/ML IJ SOLN
12.5000 mg | Freq: Once | INTRAMUSCULAR | Status: AC
  Administered 2019-07-16: 12.5 mg via INTRAVENOUS
  Filled 2019-07-16: qty 1

## 2019-07-16 MED ORDER — MORPHINE SULFATE (PF) 4 MG/ML IV SOLN
4.0000 mg | Freq: Once | INTRAVENOUS | Status: AC
  Administered 2019-07-16: 4 mg via INTRAVENOUS
  Filled 2019-07-16: qty 1
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
## 2019-07-16 NOTE — Discharge Instructions (Addendum)
Take over-the-counter medications as needed for headache.  Return to the ED for fevers chills numbness weakness or other concerning symptoms

## 2019-07-16 NOTE — ED Triage Notes (Signed)
Reports headache and generalized weakness upon waking at 0600 this morning.
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
## 2019-07-16 NOTE — ED Notes (Signed)
Pt on monitor 

## 2019-07-16 NOTE — ED Notes (Signed)
Taken to CT.  To complete triage on arrival back to room.

## 2019-07-19 DIAGNOSIS — W3400XA Accidental discharge from unspecified firearms or gun, initial encounter: Secondary | ICD-10-CM | POA: Insufficient documentation

## 2019-07-19 DIAGNOSIS — D869 Sarcoidosis, unspecified: Secondary | ICD-10-CM | POA: Insufficient documentation

## 2019-07-19 DIAGNOSIS — J45909 Unspecified asthma, uncomplicated: Secondary | ICD-10-CM | POA: Insufficient documentation

## 2019-07-19 DIAGNOSIS — G43909 Migraine, unspecified, not intractable, without status migrainosus: Secondary | ICD-10-CM | POA: Insufficient documentation

## 2019-07-28 ENCOUNTER — Ambulatory Visit (INDEPENDENT_AMBULATORY_CARE_PROVIDER_SITE_OTHER): Payer: No Typology Code available for payment source | Admitting: Internal Medicine

## 2019-07-28 ENCOUNTER — Other Ambulatory Visit: Payer: Self-pay

## 2019-07-28 ENCOUNTER — Encounter: Payer: Self-pay | Admitting: Internal Medicine

## 2019-07-28 VITALS — BP 154/87 | HR 91 | Temp 95.7°F | Resp 16 | Ht 75.0 in | Wt 303.5 lb

## 2019-07-28 DIAGNOSIS — I10 Essential (primary) hypertension: Secondary | ICD-10-CM

## 2019-07-28 DIAGNOSIS — E1159 Type 2 diabetes mellitus with other circulatory complications: Secondary | ICD-10-CM

## 2019-07-28 DIAGNOSIS — I152 Hypertension secondary to endocrine disorders: Secondary | ICD-10-CM

## 2019-07-28 DIAGNOSIS — D75839 Thrombocytosis, unspecified: Secondary | ICD-10-CM

## 2019-07-28 DIAGNOSIS — D473 Essential (hemorrhagic) thrombocythemia: Secondary | ICD-10-CM

## 2019-07-28 DIAGNOSIS — E785 Hyperlipidemia, unspecified: Secondary | ICD-10-CM

## 2019-07-28 DIAGNOSIS — G43809 Other migraine, not intractable, without status migrainosus: Secondary | ICD-10-CM | POA: Diagnosis not present

## 2019-07-28 DIAGNOSIS — E119 Type 2 diabetes mellitus without complications: Secondary | ICD-10-CM | POA: Diagnosis not present

## 2019-07-28 DIAGNOSIS — E1169 Type 2 diabetes mellitus with other specified complication: Secondary | ICD-10-CM

## 2019-07-28 MED ORDER — INSULIN LISPRO 100 UNIT/ML ~~LOC~~ SOLN
20.0000 [IU] | Freq: Three times a day (TID) | SUBCUTANEOUS | 0 refills | Status: DC
Start: 2019-07-28 — End: 2019-08-17

## 2019-07-28 MED FILL — HumaLOG 100 UNIT/ML SOLN: 100 | 17 days supply | Qty: 10 | Fill #0

## 2019-07-28 NOTE — Patient Instructions (Addendum)
Per our records you are due for an eye exam. Please contact your eye doctor to schedule an appointment. Please have them send copies of your office visit notes to [place]. Our fax number is [phone].   Please check your blood pressure twice a week, bring me a log in 2 to 3 weeks  Start taking a walk daily  Watch your food portions  Please see your endocrinologist   BP GOAL is between 110/65 and  135/85. If it is consistently higher or lower, let me know     GO TO THE FRONT DESK, PLEASE SCHEDULE YOUR APPOINTMENTS Come back for a check up in 2 to 3 months

## 2019-07-28 NOTE — Progress Notes (Signed)
Pre visit review using our clinic review tool, if applicable. No additional management support is needed unless otherwise documented below in the visit note. 
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
## 2019-07-29 DIAGNOSIS — E1169 Type 2 diabetes mellitus with other specified complication: Secondary | ICD-10-CM | POA: Insufficient documentation

## 2019-07-29 DIAGNOSIS — Z09 Encounter for follow-up examination after completed treatment for conditions other than malignant neoplasm: Secondary | ICD-10-CM | POA: Insufficient documentation

## 2019-07-29 DIAGNOSIS — E785 Hyperlipidemia, unspecified: Secondary | ICD-10-CM | POA: Insufficient documentation

## 2019-07-29 NOTE — Assessment & Plan Note (Signed)
DM: DX at age 33, initially was on Metformin, could not tolerate it, subsequently switched to insulin.  Not well controlled per last A1c, plan: Advised to see endocrinology within the next month, refilled insulin in 1 month supply at his request.    HTN: In the past he took lisinopril briefly mostly due to microalbuminuria -per pt- did not like lisinopril, did not feel well with it.  BP today slightly elevated.  We agreed on check BPs twice a week and let me know in 2 to 3 weeks how that is going. Hyperlipidemia: Last LDL 125, goal is 100, he qualifies for statins due to last LDL and a history of diabetes.  We agreed to recheck a FLP on RTC. Migraine headache: See last visit, new onset of headache associated with nausea, my impression was possibly undiagnosed migraine.  Went to the ER, CT head nonacute.  Was treated with diphenhydramine, MSO4  and  Compazine.  Since then he is much improved, we talk about possibly referring to neurology versus observation.  We agreed on observation, if symptoms resurface he will let me know. Thrombocytosis: Per chart review, consider further evaluation at some point Patient education: We had a long discussion about diet, exercise, need of portion control particularly with carbohydrates. RTC 3 months

## 2019-08-17 ENCOUNTER — Ambulatory Visit (INDEPENDENT_AMBULATORY_CARE_PROVIDER_SITE_OTHER): Payer: No Typology Code available for payment source | Admitting: Internal Medicine

## 2019-08-17 ENCOUNTER — Encounter: Payer: Self-pay | Admitting: Internal Medicine

## 2019-08-17 ENCOUNTER — Other Ambulatory Visit: Payer: Self-pay

## 2019-08-17 VITALS — BP 158/92 | HR 78 | Ht 75.0 in | Wt 307.4 lb

## 2019-08-17 DIAGNOSIS — Z794 Long term (current) use of insulin: Secondary | ICD-10-CM

## 2019-08-17 DIAGNOSIS — E1165 Type 2 diabetes mellitus with hyperglycemia: Secondary | ICD-10-CM

## 2019-08-17 DIAGNOSIS — E119 Type 2 diabetes mellitus without complications: Secondary | ICD-10-CM

## 2019-08-17 DIAGNOSIS — E785 Hyperlipidemia, unspecified: Secondary | ICD-10-CM | POA: Diagnosis not present

## 2019-08-17 LAB — POCT GLYCOSYLATED HEMOGLOBIN (HGB A1C): Hemoglobin A1C: 10.3 % — AB (ref 4.0–5.6)

## 2019-08-17 LAB — GLUCOSE, POCT (MANUAL RESULT ENTRY): POC Glucose: 79 mg/dl (ref 70–99)

## 2019-08-17 MED ORDER — DEXCOM G6 SENSOR MISC: 1.0000 | 11 refills | Status: DC

## 2019-08-17 MED ORDER — "TEGADERM FILM 4""X4-1/2"" MISC": 1.0000 | 0 refills | Status: AC

## 2019-08-17 MED ORDER — ACCU-CHEK GUIDE VI STRP: ORAL_STRIP | 3 refills | Status: AC

## 2019-08-17 MED ORDER — INSULIN LISPRO (1 UNIT DIAL) 100 UNIT/ML (KWIKPEN): 12.0000 [IU] | PEN_INJECTOR | Freq: Three times a day (TID) | SUBCUTANEOUS | 11 refills | Status: DC

## 2019-08-17 MED ORDER — DEXCOM G6 TRANSMITTER MISC: 1.0000 | 3 refills | Status: DC

## 2019-08-17 MED ORDER — INSULIN PEN NEEDLE 29G X 5MM MISC: 1.0000 | Freq: Four times a day (QID) | 3 refills | Status: AC

## 2019-08-17 MED ORDER — DEXCOM G6 RECEIVER DEVI: 1.0000 | 0 refills | Status: DC

## 2019-08-17 MED ORDER — OZEMPIC (0.25 OR 0.5 MG/DOSE) 2 MG/1.5ML ~~LOC~~ SOPN: 0.5000 mg | PEN_INJECTOR | SUBCUTANEOUS | 6 refills | Status: DC

## 2019-08-17 MED ORDER — TRESIBA FLEXTOUCH 100 UNIT/ML ~~LOC~~ SOPN: 32.0000 [IU] | PEN_INJECTOR | Freq: Every day | SUBCUTANEOUS | 6 refills | Status: DC

## 2019-08-17 MED FILL — TRESIBA FLEXTOUCH 100 UNITS: 100 | 28 days supply | Qty: 9 | Fill #0

## 2019-08-17 MED FILL — FREESTYLE LANCETS: 33 days supply | Qty: 100 | Fill #0

## 2019-08-17 MED FILL — FREESTYLE LITE TEST STRIP: 33 days supply | Qty: 100 | Fill #0

## 2019-08-17 MED FILL — FREESTYLE LITE METER: 1 days supply | Qty: 1 | Fill #0

## 2019-08-17 MED FILL — HUMALOG 100 UNITS/ML KWIKPE: 100 | 47 days supply | Qty: 30 | Fill #0

## 2019-08-17 MED FILL — PENTIPS 31G X 5 MM MISC: 31G X 5 MM | 75 days supply | Qty: 300 | Fill #0

## 2019-08-17 MED FILL — OZEMPIC 0.25 OR 0.5 MG/DOSE: 2 | 28 days supply | Qty: 2 | Fill #0

## 2019-08-17 NOTE — Patient Instructions (Addendum)
-   Start Ozempic 0.25 mg weekly for 6 weeks, if no side effects, please increase to 0.5 mg weekly  - Start Tresiba 32 units daily  - Humalog 12 units with each meal  -Humalog correctional insulin: ADD extra units on insulin to your meal-time Humalog dose if your blood sugars are higher than 155. Use the scale below to help guide you:   Blood sugar before meal Number of units to inject  Less than 155 0 unit  156 -  180 1 units  181 -  205 2 units  206 -  230 3 units  231 -  255 4 units  256 -  280 5 units  281 -  305 6 units  306-  330 7 units  331 -  355 8 units     Choose healthy, lower carb lower calorie snacks: toss salad, vegetables, cottage cheese, peanut butter, low fat cheese / string cheese, lower sodium deli meat, tuna salad or chicken salad     HOW TO TREAT LOW BLOOD SUGARS (Blood sugar LESS THAN 70 MG/DL)  Please follow the RULE OF 15 for the treatment of hypoglycemia treatment (when your (blood sugars are less than 70 mg/dL)    STEP 1: Take 15 grams of carbohydrates when your blood sugar is low, which includes:   3-4 GLUCOSE TABS  OR  3-4 OZ OF JUICE OR REGULAR SODA OR  ONE TUBE OF GLUCOSE GEL     STEP 2: RECHECK blood sugar in 15 MINUTES STEP 3: If your blood sugar is still low at the 15 minute recheck --> then, go back to STEP 1 and treat AGAIN with another 15 grams of carbohydrates.
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
## 2019-08-20 MED FILL — DEXCOM G6 TRANSMITTER MISC: 90 days supply | Qty: 1 | Fill #0

## 2019-08-20 MED FILL — DEXCOM G6 SENSOR MISC: 30 days supply | Qty: 3 | Fill #0

## 2019-08-20 MED FILL — DEXCOM G6 RECEIVER DEVI: 90 days supply | Qty: 1 | Fill #0

## 2019-08-25 ENCOUNTER — Telehealth: Payer: Self-pay

## 2019-08-25 NOTE — Telephone Encounter (Signed)
Approval received for dexcom G6 from 08/19/2019-08/17/2020 3 sensors per 30 days reference# [id number]. Approval for dexcom G6 meter 1 per 12 months reference # [id number]

## 2019-09-03 ENCOUNTER — Other Ambulatory Visit: Payer: Self-pay

## 2019-09-03 MED ORDER — BREO ELLIPTA 200-25 MCG/INH IN AEPB: 1.0000 | INHALATION_SPRAY | Freq: Every day | RESPIRATORY_TRACT | 6 refills | Status: DC

## 2019-09-03 MED FILL — BREO ELLIPTA 200-25 MCG INH: 200-25 | 30 days supply | Qty: 60 | Fill #0

## 2019-10-26 ENCOUNTER — Encounter: Payer: No Typology Code available for payment source | Admitting: Internal Medicine

## 2019-10-27 ENCOUNTER — Encounter: Payer: Self-pay | Admitting: Internal Medicine

## 2019-11-23 ENCOUNTER — Ambulatory Visit: Payer: No Typology Code available for payment source | Admitting: Internal Medicine

## 2019-11-23 DIAGNOSIS — Z0289 Encounter for other administrative examinations: Secondary | ICD-10-CM
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
## 2020-03-13 DIAGNOSIS — E119 Type 2 diabetes mellitus without complications: Secondary | ICD-10-CM | POA: Diagnosis not present

## 2020-03-13 DIAGNOSIS — R0683 Snoring: Secondary | ICD-10-CM | POA: Diagnosis not present

## 2020-03-13 DIAGNOSIS — Z794 Long term (current) use of insulin: Secondary | ICD-10-CM | POA: Diagnosis not present

## 2020-03-17 DIAGNOSIS — R059 Cough, unspecified: Secondary | ICD-10-CM | POA: Diagnosis not present

## 2020-03-17 DIAGNOSIS — G4710 Hypersomnia, unspecified: Secondary | ICD-10-CM | POA: Diagnosis not present

## 2020-03-17 DIAGNOSIS — R0683 Snoring: Secondary | ICD-10-CM | POA: Diagnosis not present

## 2020-03-27 DIAGNOSIS — G4710 Hypersomnia, unspecified: Secondary | ICD-10-CM | POA: Diagnosis not present

## 2020-03-28 DIAGNOSIS — R0683 Snoring: Secondary | ICD-10-CM | POA: Diagnosis not present

## 2020-03-28 DIAGNOSIS — G4733 Obstructive sleep apnea (adult) (pediatric): Secondary | ICD-10-CM | POA: Diagnosis not present

## 2020-03-28 DIAGNOSIS — G4710 Hypersomnia, unspecified: Secondary | ICD-10-CM | POA: Diagnosis not present

## 2020-04-19 DIAGNOSIS — E119 Type 2 diabetes mellitus without complications: Secondary | ICD-10-CM | POA: Diagnosis not present

## 2020-06-13 ENCOUNTER — Encounter: Payer: Self-pay | Admitting: Emergency Medicine

## 2020-06-13 ENCOUNTER — Emergency Department
Admission: EM | Admit: 2020-06-13 | Discharge: 2020-06-14 | Disposition: A | Discharge status: Home / Self Care | Payer: BC Managed Care – PPO | Attending: Emergency Medicine | Admitting: Emergency Medicine

## 2020-06-13 ENCOUNTER — Other Ambulatory Visit: Payer: Self-pay

## 2020-06-13 DIAGNOSIS — Z794 Long term (current) use of insulin: Secondary | ICD-10-CM | POA: Diagnosis not present

## 2020-06-13 DIAGNOSIS — L7622 Postprocedural hemorrhage and hematoma of skin and subcutaneous tissue following other procedure: Secondary | ICD-10-CM | POA: Diagnosis not present

## 2020-06-13 DIAGNOSIS — X58XXXA Exposure to other specified factors, initial encounter: Secondary | ICD-10-CM | POA: Diagnosis not present

## 2020-06-13 DIAGNOSIS — Z5189 Encounter for other specified aftercare: Secondary | ICD-10-CM

## 2020-06-13 DIAGNOSIS — S79921A Unspecified injury of right thigh, initial encounter: Secondary | ICD-10-CM | POA: Diagnosis not present

## 2020-06-13 DIAGNOSIS — Z7951 Long term (current) use of inhaled steroids: Secondary | ICD-10-CM | POA: Insufficient documentation

## 2020-06-13 DIAGNOSIS — I10 Essential (primary) hypertension: Secondary | ICD-10-CM | POA: Diagnosis not present

## 2020-06-13 DIAGNOSIS — S71111A Laceration without foreign body, right thigh, initial encounter: Secondary | ICD-10-CM | POA: Insufficient documentation

## 2020-06-13 DIAGNOSIS — Z4801 Encounter for change or removal of surgical wound dressing: Secondary | ICD-10-CM | POA: Diagnosis not present

## 2020-06-13 DIAGNOSIS — E119 Type 2 diabetes mellitus without complications: Secondary | ICD-10-CM | POA: Diagnosis not present

## 2020-06-13 DIAGNOSIS — T8131XA Disruption of external operation (surgical) wound, not elsewhere classified, initial encounter: Secondary | ICD-10-CM | POA: Diagnosis not present

## 2020-06-13 DIAGNOSIS — J45909 Unspecified asthma, uncomplicated: Secondary | ICD-10-CM | POA: Diagnosis not present

## 2020-06-13 DIAGNOSIS — S71101A Unspecified open wound, right thigh, initial encounter: Secondary | ICD-10-CM | POA: Diagnosis not present

## 2020-06-13 DIAGNOSIS — L918 Other hypertrophic disorders of the skin: Secondary | ICD-10-CM | POA: Diagnosis not present

## 2020-06-13 MED ORDER — LIDOCAINE-EPINEPHRINE 2 %-1:100000 IJ SOLN
20.0000 mL | Freq: Once | INTRAMUSCULAR | Status: AC
  Administered 2020-06-14: 20 mL
  Filled 2020-06-13: qty 1
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
## 2020-06-14 MED ORDER — CEPHALEXIN 500 MG PO CAPS: 500.0000 mg | ORAL_CAPSULE | Freq: Three times a day (TID) | ORAL | 0 refills | Status: AC
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
## 2020-06-14 NOTE — Discharge Instructions (Signed)
Take Keflex three times daily for the next seven days. Remove suture in seven days. Return with new or worsening symptoms.

## 2020-07-28 DIAGNOSIS — G4733 Obstructive sleep apnea (adult) (pediatric): Secondary | ICD-10-CM | POA: Diagnosis not present

## 2020-08-08 DIAGNOSIS — E1169 Type 2 diabetes mellitus with other specified complication: Secondary | ICD-10-CM | POA: Diagnosis not present

## 2020-08-08 DIAGNOSIS — G4730 Sleep apnea, unspecified: Secondary | ICD-10-CM | POA: Diagnosis not present

## 2020-08-08 DIAGNOSIS — R635 Abnormal weight gain: Secondary | ICD-10-CM | POA: Diagnosis not present

## 2020-08-08 DIAGNOSIS — F901 Attention-deficit hyperactivity disorder, predominantly hyperactive type: Secondary | ICD-10-CM | POA: Diagnosis not present

## 2020-08-08 DIAGNOSIS — I10 Essential (primary) hypertension: Secondary | ICD-10-CM | POA: Diagnosis not present

## 2020-08-22 ENCOUNTER — Other Ambulatory Visit (HOSPITAL_COMMUNITY): Payer: Self-pay

## 2020-08-22 DIAGNOSIS — E1169 Type 2 diabetes mellitus with other specified complication: Secondary | ICD-10-CM | POA: Diagnosis not present

## 2020-08-22 DIAGNOSIS — K219 Gastro-esophageal reflux disease without esophagitis: Secondary | ICD-10-CM | POA: Diagnosis not present

## 2020-08-22 MED ORDER — OZEMPIC (1 MG/DOSE) 4 MG/3ML ~~LOC~~ SOPN
1.0000 mg | PEN_INJECTOR | SUBCUTANEOUS | 0 refills | Status: DC
  Filled 2020-08-22 – 2020-08-23 (×2): qty 3, 28d supply, fill #0
  Filled 2020-10-24: qty 3, 28d supply, fill #1
  Filled 2021-01-22 – 2021-06-18 (×2): qty 3, 28d supply, fill #2

## 2020-08-22 MED ORDER — LISDEXAMFETAMINE DIMESYLATE 20 MG PO CAPS
20.0000 mg | ORAL_CAPSULE | Freq: Every morning | ORAL | 0 refills | Status: DC
  Filled 2020-08-22 – 2020-12-25 (×6): qty 30, 30d supply, fill #0

## 2020-08-22 MED ORDER — TELMISARTAN-HCTZ 40-12.5 MG PO TABS
1.0000 | ORAL_TABLET | Freq: Every day | ORAL | 1 refills | Status: DC
  Filled 2020-08-22 – 2020-08-23 (×2): qty 30, 30d supply, fill #0
  Filled 2020-09-21: qty 30, 30d supply, fill #1
  Filled 2020-10-24: qty 30, 30d supply, fill #2
  Filled 2020-11-27: qty 30, 30d supply, fill #3

## 2020-08-22 MED ORDER — FREESTYLE LIBRE 2 SENSOR MISC
5 refills | Status: DC
  Filled 2020-08-22: qty 2, 28d supply, fill #0

## 2020-08-23 ENCOUNTER — Other Ambulatory Visit (HOSPITAL_COMMUNITY): Payer: Self-pay

## 2020-08-30 ENCOUNTER — Other Ambulatory Visit (HOSPITAL_COMMUNITY): Payer: Self-pay

## 2020-09-06 ENCOUNTER — Other Ambulatory Visit (HOSPITAL_COMMUNITY): Payer: Self-pay

## 2020-09-14 DIAGNOSIS — Z794 Long term (current) use of insulin: Secondary | ICD-10-CM | POA: Diagnosis not present

## 2020-09-15 ENCOUNTER — Other Ambulatory Visit (HOSPITAL_COMMUNITY): Payer: Self-pay

## 2020-09-19 DIAGNOSIS — E1169 Type 2 diabetes mellitus with other specified complication: Secondary | ICD-10-CM | POA: Diagnosis not present

## 2020-09-19 DIAGNOSIS — I10 Essential (primary) hypertension: Secondary | ICD-10-CM | POA: Diagnosis not present

## 2020-09-21 ENCOUNTER — Other Ambulatory Visit (HOSPITAL_COMMUNITY): Payer: Self-pay

## 2020-09-22 ENCOUNTER — Other Ambulatory Visit (HOSPITAL_COMMUNITY): Payer: Self-pay

## 2020-10-03 ENCOUNTER — Other Ambulatory Visit (HOSPITAL_COMMUNITY): Payer: Self-pay

## 2020-10-09 ENCOUNTER — Other Ambulatory Visit (HOSPITAL_COMMUNITY): Payer: Self-pay

## 2020-10-11 ENCOUNTER — Other Ambulatory Visit (HOSPITAL_COMMUNITY): Payer: Self-pay

## 2020-10-17 ENCOUNTER — Other Ambulatory Visit (HOSPITAL_COMMUNITY): Payer: Self-pay

## 2020-10-20 DIAGNOSIS — G4733 Obstructive sleep apnea (adult) (pediatric): Secondary | ICD-10-CM | POA: Diagnosis not present

## 2020-10-24 ENCOUNTER — Other Ambulatory Visit (HOSPITAL_COMMUNITY): Payer: Self-pay

## 2020-10-31 ENCOUNTER — Other Ambulatory Visit (HOSPITAL_COMMUNITY): Payer: Self-pay

## 2020-10-31 DIAGNOSIS — E1169 Type 2 diabetes mellitus with other specified complication: Secondary | ICD-10-CM | POA: Diagnosis not present

## 2020-10-31 DIAGNOSIS — I10 Essential (primary) hypertension: Secondary | ICD-10-CM | POA: Diagnosis not present

## 2020-10-31 MED ORDER — LISDEXAMFETAMINE DIMESYLATE 20 MG PO CAPS
20.0000 mg | ORAL_CAPSULE | Freq: Every morning | ORAL | 0 refills | Status: DC
  Filled 2020-10-31 – 2020-12-26 (×3): qty 30, 30d supply, fill #0

## 2020-11-02 ENCOUNTER — Other Ambulatory Visit (HOSPITAL_COMMUNITY): Payer: Self-pay

## 2020-11-02 MED ORDER — OZEMPIC (2 MG/DOSE) 8 MG/3ML ~~LOC~~ SOPN
2.0000 mg | PEN_INJECTOR | SUBCUTANEOUS | 0 refills | Status: DC
  Filled 2020-11-02 – 2020-11-27 (×2): qty 3, 28d supply, fill #0

## 2020-11-27 ENCOUNTER — Other Ambulatory Visit (HOSPITAL_COMMUNITY): Payer: Self-pay

## 2020-11-28 ENCOUNTER — Other Ambulatory Visit (HOSPITAL_COMMUNITY): Payer: Self-pay

## 2020-12-25 ENCOUNTER — Other Ambulatory Visit (HOSPITAL_COMMUNITY): Payer: Self-pay

## 2020-12-25 MED ORDER — TELMISARTAN-HCTZ 40-12.5 MG PO TABS
1.0000 | ORAL_TABLET | Freq: Every day | ORAL | 1 refills | Status: DC
  Filled 2020-12-25: qty 30, 30d supply, fill #0
  Filled 2021-01-22: qty 30, 30d supply, fill #1
  Filled 2021-03-02: qty 30, 30d supply, fill #2
  Filled 2021-04-03: qty 30, 30d supply, fill #3
  Filled 2021-05-02: qty 30, 30d supply, fill #4
  Filled 2021-05-31: qty 30, 30d supply, fill #5

## 2020-12-26 ENCOUNTER — Other Ambulatory Visit (HOSPITAL_COMMUNITY): Payer: Self-pay

## 2020-12-26 DIAGNOSIS — E538 Deficiency of other specified B group vitamins: Secondary | ICD-10-CM | POA: Diagnosis not present

## 2020-12-26 DIAGNOSIS — E1169 Type 2 diabetes mellitus with other specified complication: Secondary | ICD-10-CM | POA: Diagnosis not present

## 2020-12-26 DIAGNOSIS — F064 Anxiety disorder due to known physiological condition: Secondary | ICD-10-CM | POA: Diagnosis not present

## 2020-12-26 DIAGNOSIS — F901 Attention-deficit hyperactivity disorder, predominantly hyperactive type: Secondary | ICD-10-CM | POA: Diagnosis not present

## 2020-12-26 DIAGNOSIS — I10 Essential (primary) hypertension: Secondary | ICD-10-CM | POA: Diagnosis not present

## 2020-12-26 MED ORDER — OZEMPIC (2 MG/DOSE) 8 MG/3ML ~~LOC~~ SOPN
2.0000 mg | PEN_INJECTOR | SUBCUTANEOUS | 0 refills | Status: DC
  Filled 2020-12-26: qty 3, 28d supply, fill #0
  Filled 2021-01-22 – 2021-04-23 (×3): qty 3, 28d supply, fill #1
  Filled 2021-05-14: qty 3, 28d supply, fill #2

## 2020-12-26 MED ORDER — CO-ENZYME Q10 100 MG PO CAPS: ORAL_CAPSULE | ORAL | 1 refills | Status: DC

## 2020-12-27 ENCOUNTER — Other Ambulatory Visit (HOSPITAL_COMMUNITY): Payer: Self-pay

## 2021-01-05 DIAGNOSIS — D75839 Thrombocytosis, unspecified: Secondary | ICD-10-CM | POA: Diagnosis not present

## 2021-01-05 DIAGNOSIS — D691 Qualitative platelet defects: Secondary | ICD-10-CM | POA: Diagnosis not present

## 2021-01-22 ENCOUNTER — Other Ambulatory Visit (HOSPITAL_COMMUNITY): Payer: Self-pay

## 2021-01-22 DIAGNOSIS — G4733 Obstructive sleep apnea (adult) (pediatric): Secondary | ICD-10-CM | POA: Diagnosis not present

## 2021-01-30 ENCOUNTER — Other Ambulatory Visit (HOSPITAL_COMMUNITY): Payer: Self-pay

## 2021-03-02 ENCOUNTER — Other Ambulatory Visit (HOSPITAL_COMMUNITY): Payer: Self-pay

## 2021-04-03 ENCOUNTER — Other Ambulatory Visit (HOSPITAL_COMMUNITY): Payer: Self-pay

## 2021-04-23 ENCOUNTER — Other Ambulatory Visit (HOSPITAL_COMMUNITY): Payer: Self-pay

## 2021-05-02 ENCOUNTER — Other Ambulatory Visit (HOSPITAL_COMMUNITY): Payer: Self-pay

## 2021-05-14 ENCOUNTER — Other Ambulatory Visit (HOSPITAL_COMMUNITY): Payer: Self-pay

## 2021-05-31 ENCOUNTER — Other Ambulatory Visit (HOSPITAL_COMMUNITY): Payer: Self-pay

## 2021-06-18 ENCOUNTER — Other Ambulatory Visit (HOSPITAL_COMMUNITY): Payer: Self-pay

## 2021-06-29 ENCOUNTER — Other Ambulatory Visit (HOSPITAL_COMMUNITY): Payer: Self-pay

## 2021-06-29 MED ORDER — OZEMPIC (2 MG/DOSE) 8 MG/3ML ~~LOC~~ SOPN
2.0000 mg | PEN_INJECTOR | SUBCUTANEOUS | 0 refills | Status: DC
  Filled 2021-06-29: qty 3, 28d supply, fill #0

## 2021-07-03 ENCOUNTER — Other Ambulatory Visit (HOSPITAL_COMMUNITY): Payer: Self-pay

## 2021-07-04 ENCOUNTER — Other Ambulatory Visit (HOSPITAL_COMMUNITY): Payer: Self-pay

## 2021-07-04 MED ORDER — TELMISARTAN-HCTZ 40-12.5 MG PO TABS
1.0000 | ORAL_TABLET | Freq: Every day | ORAL | 1 refills | Status: DC
  Filled 2021-07-04: qty 30, 30d supply, fill #0

## 2021-07-05 ENCOUNTER — Other Ambulatory Visit (HOSPITAL_COMMUNITY): Payer: Self-pay

## 2021-07-13 ENCOUNTER — Other Ambulatory Visit (HOSPITAL_COMMUNITY): Payer: Self-pay

## 2021-07-13 MED ORDER — TELMISARTAN-HCTZ 80-25 MG PO TABS
1.0000 | ORAL_TABLET | Freq: Every day | ORAL | 0 refills | Status: DC
  Filled 2021-07-13: qty 30, 30d supply, fill #0
  Filled 2021-08-13: qty 30, 30d supply, fill #1
  Filled 2021-09-13: qty 30, 30d supply, fill #2

## 2021-07-24 ENCOUNTER — Other Ambulatory Visit (HOSPITAL_COMMUNITY): Payer: Self-pay

## 2021-07-25 ENCOUNTER — Other Ambulatory Visit (HOSPITAL_COMMUNITY): Payer: Self-pay

## 2021-07-25 MED ORDER — OZEMPIC (2 MG/DOSE) 8 MG/3ML ~~LOC~~ SOPN
2.0000 mg | PEN_INJECTOR | SUBCUTANEOUS | 1 refills | Status: DC
  Filled 2021-07-25: qty 3, 28d supply, fill #0
  Filled 2021-08-27: qty 3, 28d supply, fill #1
  Filled 2021-09-13 – 2021-10-04 (×4): qty 3, 28d supply, fill #2
  Filled 2021-11-01: qty 3, 28d supply, fill #3
  Filled 2021-11-23: qty 3, 28d supply, fill #4
  Filled 2021-12-24: qty 3, 28d supply, fill #5

## 2021-08-13 ENCOUNTER — Other Ambulatory Visit (HOSPITAL_COMMUNITY): Payer: Self-pay

## 2021-08-27 ENCOUNTER — Other Ambulatory Visit (HOSPITAL_COMMUNITY): Payer: Self-pay

## 2021-09-06 ENCOUNTER — Ambulatory Visit: Payer: BC Managed Care – PPO | Admitting: Cardiology

## 2021-09-06 ENCOUNTER — Encounter: Payer: Self-pay | Admitting: Cardiology

## 2021-09-06 VITALS — BP 135/89 | HR 98 | Temp 98.4°F | Resp 12 | Ht 75.0 in | Wt 274.8 lb

## 2021-09-06 DIAGNOSIS — I152 Hypertension secondary to endocrine disorders: Secondary | ICD-10-CM
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
## 2021-09-13 ENCOUNTER — Other Ambulatory Visit (HOSPITAL_COMMUNITY): Payer: Self-pay

## 2021-09-20 ENCOUNTER — Other Ambulatory Visit (HOSPITAL_COMMUNITY): Payer: Self-pay

## 2021-09-21 ENCOUNTER — Other Ambulatory Visit (HOSPITAL_COMMUNITY): Payer: Self-pay

## 2021-09-24 ENCOUNTER — Other Ambulatory Visit (HOSPITAL_COMMUNITY): Payer: Self-pay

## 2021-09-26 ENCOUNTER — Other Ambulatory Visit (HOSPITAL_COMMUNITY): Payer: Self-pay

## 2021-09-27 ENCOUNTER — Ambulatory Visit
Admission: RE | Admit: 2021-09-27 | Discharge: 2021-09-27 | Disposition: A | Discharge status: Home / Self Care | Payer: BC Managed Care – PPO | Source: Ambulatory Visit | Attending: Cardiology | Admitting: Cardiology

## 2021-09-27 DIAGNOSIS — E1159 Type 2 diabetes mellitus with other circulatory complications: Secondary | ICD-10-CM

## 2021-10-01 ENCOUNTER — Other Ambulatory Visit (HOSPITAL_COMMUNITY): Payer: Self-pay

## 2021-10-02 ENCOUNTER — Other Ambulatory Visit (HOSPITAL_COMMUNITY): Payer: Self-pay

## 2021-10-04 ENCOUNTER — Encounter (HOSPITAL_BASED_OUTPATIENT_CLINIC_OR_DEPARTMENT_OTHER): Payer: Self-pay | Admitting: Pharmacist

## 2021-10-04 ENCOUNTER — Other Ambulatory Visit (HOSPITAL_BASED_OUTPATIENT_CLINIC_OR_DEPARTMENT_OTHER): Payer: Self-pay

## 2021-10-04 MED ORDER — TELMISARTAN-HCTZ 80-25 MG PO TABS
1.0000 | ORAL_TABLET | Freq: Every day | ORAL | 0 refills | Status: DC
  Filled 2021-10-04 – 2021-10-05 (×2): qty 30, 30d supply, fill #0
  Filled 2021-11-13: qty 30, 30d supply, fill #1
  Filled 2021-12-12: qty 30, 30d supply, fill #2

## 2021-10-05 ENCOUNTER — Other Ambulatory Visit (HOSPITAL_BASED_OUTPATIENT_CLINIC_OR_DEPARTMENT_OTHER): Payer: Self-pay

## 2021-10-26 ENCOUNTER — Other Ambulatory Visit (HOSPITAL_COMMUNITY): Payer: Self-pay

## 2021-10-29 ENCOUNTER — Other Ambulatory Visit (HOSPITAL_COMMUNITY): Payer: Self-pay

## 2021-10-29 MED ORDER — LISDEXAMFETAMINE DIMESYLATE 20 MG PO CAPS
20.0000 mg | ORAL_CAPSULE | Freq: Every morning | ORAL | 0 refills | Status: DC
  Filled 2021-10-29: qty 30, 30d supply, fill #0

## 2021-11-01 ENCOUNTER — Other Ambulatory Visit (HOSPITAL_COMMUNITY): Payer: Self-pay

## 2021-11-13 ENCOUNTER — Other Ambulatory Visit (HOSPITAL_COMMUNITY): Payer: Self-pay

## 2021-11-21 ENCOUNTER — Telehealth: Payer: Self-pay | Admitting: Oncology

## 2021-11-21 NOTE — Telephone Encounter (Signed)
Scheduled appt per 9/19 referral. Pt is aware of appt date and time. Pt is aware to arrive 15 mins prior to appt time and to bring and updated insurance card. Pt is aware of appt location.   

## 2021-11-23 ENCOUNTER — Other Ambulatory Visit (HOSPITAL_COMMUNITY): Payer: Self-pay

## 2021-12-12 ENCOUNTER — Other Ambulatory Visit (HOSPITAL_COMMUNITY): Payer: Self-pay

## 2021-12-14 ENCOUNTER — Other Ambulatory Visit (HOSPITAL_COMMUNITY): Payer: Self-pay

## 2021-12-14 ENCOUNTER — Other Ambulatory Visit: Payer: Self-pay

## 2021-12-14 ENCOUNTER — Inpatient Hospital Stay: Payer: BC Managed Care – PPO

## 2021-12-14 ENCOUNTER — Inpatient Hospital Stay: Payer: BC Managed Care – PPO | Attending: Oncology | Admitting: Oncology

## 2021-12-14 VITALS — BP 130/83 | HR 63 | Temp 97.9°F | Resp 17 | Ht 75.0 in | Wt 276.3 lb

## 2021-12-14 DIAGNOSIS — D75839 Thrombocytosis, unspecified: Secondary | ICD-10-CM

## 2021-12-14 DIAGNOSIS — D649 Anemia, unspecified: Secondary | ICD-10-CM

## 2021-12-14 LAB — COMPREHENSIVE METABOLIC PANEL
ALT: 25 U/L (ref 0–44)
AST: 24 U/L (ref 15–41)
Albumin: 4.3 g/dL (ref 3.5–5.0)
Alkaline Phosphatase: 49 U/L (ref 38–126)
Anion gap: 5 (ref 5–15)
BUN: 16 mg/dL (ref 6–20)
CO2: 30 mmol/L (ref 22–32)
Calcium: 9.4 mg/dL (ref 8.9–10.3)
Chloride: 105 mmol/L (ref 98–111)
Creatinine, Ser: 1.07 mg/dL (ref 0.61–1.24)
GFR, Estimated: >60 mL/min (ref >60)
Glucose, Bld: 120 mg/dL — ABNORMAL HIGH (ref 70–99)
Potassium: 4 mmol/L (ref 3.5–5.1)
Sodium: 140 mmol/L (ref 135–145)
Total Bilirubin: 0.6 mg/dL (ref 0.3–1.2)
Total Protein: 7.2 g/dL (ref 6.5–8.1)

## 2021-12-14 LAB — CBC WITH DIFFERENTIAL/PLATELET
Abs Immature Granulocytes: 0.02 10*3/uL (ref 0.00–0.07)
Basophils Absolute: 0 10*3/uL (ref 0.0–0.1)
Basophils Relative: 1 %
Eosinophils Absolute: 0.1 10*3/uL (ref 0.0–0.5)
Eosinophils Relative: 2 %
HCT: 42.9 % (ref 39.0–52.0)
Hemoglobin: 14.9 g/dL (ref 13.0–17.0)
Immature Granulocytes: 0 %
Lymphocytes Relative: 40 %
Lymphs Abs: 3 10*3/uL (ref 0.7–4.0)
MCH: 29.7 pg (ref 26.0–34.0)
MCHC: 34.7 g/dL (ref 30.0–36.0)
MCV: 85.5 fL (ref 80.0–100.0)
Monocytes Absolute: 0.6 10*3/uL (ref 0.1–1.0)
Monocytes Relative: 7 %
Neutro Abs: 3.8 10*3/uL (ref 1.7–7.7)
Neutrophils Relative %: 50 %
Platelets: 443 10*3/uL — ABNORMAL HIGH (ref 150–400)
RBC: 5.02 MIL/uL (ref 4.22–5.81)
RDW: 13 % (ref 11.5–15.5)
WBC: 7.6 10*3/uL (ref 4.0–10.5)
nRBC: 0 % (ref 0.0–0.2)

## 2021-12-14 LAB — FERRITIN: Ferritin: 130 ng/mL (ref 24–336)

## 2021-12-14 LAB — C-REACTIVE PROTEIN: CRP: 1.3 mg/dL — ABNORMAL HIGH (ref <1.0)

## 2021-12-14 LAB — TECHNOLOGIST SMEAR REVIEW: Plt Morphology: INCREASED
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
## 2021-12-17 ENCOUNTER — Other Ambulatory Visit (HOSPITAL_COMMUNITY): Payer: Self-pay

## 2021-12-17 ENCOUNTER — Telehealth: Payer: Self-pay | Admitting: Oncology

## 2021-12-17 MED ORDER — LISDEXAMFETAMINE DIMESYLATE 20 MG PO CAPS
20.0000 mg | ORAL_CAPSULE | Freq: Every morning | ORAL | 0 refills | Status: AC
  Filled 2021-12-17: qty 30, 30d supply, fill #0
  Filled 2022-02-13 – 2022-03-14 (×2): qty 30, 30d supply, fill #1

## 2021-12-17 NOTE — Telephone Encounter (Signed)
Scheduled appt per 10/13 los. Pt is aware.

## 2021-12-24 LAB — BCR ABL1 FISH (GENPATH)

## 2021-12-24 LAB — JAK2 (INCLUDING V617F AND EXON 12), MPL,& CALR-NEXT GEN SEQ

## 2021-12-25 DIAGNOSIS — D75839 Thrombocytosis, unspecified: Secondary | ICD-10-CM | POA: Insufficient documentation

## 2021-12-31 ENCOUNTER — Other Ambulatory Visit (HOSPITAL_COMMUNITY): Payer: Self-pay

## 2022-01-04 ENCOUNTER — Ambulatory Visit: Payer: BC Managed Care – PPO | Admitting: Oncology

## 2022-01-04 ENCOUNTER — Inpatient Hospital Stay: Payer: BC Managed Care – PPO | Attending: Oncology | Admitting: Oncology

## 2022-01-04 VITALS — BP 127/88 | HR 70 | Temp 98.2°F | Resp 18 | Wt 270.5 lb

## 2022-01-04 DIAGNOSIS — J45909 Unspecified asthma, uncomplicated: Secondary | ICD-10-CM | POA: Diagnosis not present

## 2022-01-04 DIAGNOSIS — I10 Essential (primary) hypertension: Secondary | ICD-10-CM | POA: Insufficient documentation

## 2022-01-04 DIAGNOSIS — E669 Obesity, unspecified: Secondary | ICD-10-CM | POA: Insufficient documentation

## 2022-01-04 DIAGNOSIS — Z832 Family history of diseases of the blood and blood-forming organs and certain disorders involving the immune mechanism: Secondary | ICD-10-CM | POA: Diagnosis not present

## 2022-01-04 DIAGNOSIS — Z7189 Other specified counseling: Secondary | ICD-10-CM | POA: Diagnosis not present

## 2022-01-04 DIAGNOSIS — D75839 Thrombocytosis, unspecified: Secondary | ICD-10-CM | POA: Insufficient documentation

## 2022-01-04 DIAGNOSIS — Z8249 Family history of ischemic heart disease and other diseases of the circulatory system: Secondary | ICD-10-CM | POA: Diagnosis not present

## 2022-01-04 DIAGNOSIS — Z79899 Other long term (current) drug therapy: Secondary | ICD-10-CM | POA: Insufficient documentation

## 2022-01-04 DIAGNOSIS — Z886 Allergy status to analgesic agent status: Secondary | ICD-10-CM | POA: Diagnosis not present

## 2022-01-04 DIAGNOSIS — E1165 Type 2 diabetes mellitus with hyperglycemia: Secondary | ICD-10-CM

## 2022-01-04 DIAGNOSIS — E785 Hyperlipidemia, unspecified: Secondary | ICD-10-CM | POA: Insufficient documentation

## 2022-01-04 DIAGNOSIS — R059 Cough, unspecified: Secondary | ICD-10-CM | POA: Diagnosis not present

## 2022-01-04 DIAGNOSIS — E119 Type 2 diabetes mellitus without complications: Secondary | ICD-10-CM | POA: Diagnosis not present

## 2022-01-04 DIAGNOSIS — D869 Sarcoidosis, unspecified: Secondary | ICD-10-CM | POA: Insufficient documentation

## 2022-01-04 DIAGNOSIS — Z794 Long term (current) use of insulin: Secondary | ICD-10-CM | POA: Diagnosis not present
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
## 2022-01-14 DIAGNOSIS — Z7189 Other specified counseling: Secondary | ICD-10-CM | POA: Insufficient documentation

## 2022-01-15 ENCOUNTER — Other Ambulatory Visit (HOSPITAL_COMMUNITY): Payer: Self-pay

## 2022-01-15 MED ORDER — TELMISARTAN-HCTZ 80-25 MG PO TABS
1.0000 | ORAL_TABLET | Freq: Every day | ORAL | 0 refills | Status: DC
  Filled 2022-01-15: qty 30, 30d supply, fill #0
  Filled 2022-02-20 (×2): qty 30, 30d supply, fill #1
  Filled 2022-03-20: qty 30, 30d supply, fill #2

## 2022-01-21 ENCOUNTER — Other Ambulatory Visit (HOSPITAL_COMMUNITY): Payer: Self-pay

## 2022-01-22 ENCOUNTER — Other Ambulatory Visit (HOSPITAL_COMMUNITY): Payer: Self-pay

## 2022-01-22 MED ORDER — SEMAGLUTIDE (2 MG/DOSE) 8 MG/3ML ~~LOC~~ SOPN
2.0000 mg | PEN_INJECTOR | SUBCUTANEOUS | 1 refills | Status: AC
  Filled 2022-01-22: qty 3, 28d supply, fill #0
  Filled 2022-02-20 (×2): qty 3, 28d supply, fill #1
  Filled 2022-03-20: qty 3, 28d supply, fill #2

## 2022-02-13 ENCOUNTER — Encounter: Payer: Self-pay | Admitting: Oncology

## 2022-02-14 ENCOUNTER — Other Ambulatory Visit (HOSPITAL_COMMUNITY): Payer: Self-pay

## 2022-02-15 ENCOUNTER — Other Ambulatory Visit (HOSPITAL_COMMUNITY): Payer: Self-pay

## 2022-02-15 MED ORDER — LISDEXAMFETAMINE DIMESYLATE 20 MG PO CAPS
20.0000 mg | ORAL_CAPSULE | Freq: Every morning | ORAL | 0 refills | Status: AC
  Filled 2022-02-15: qty 30, 30d supply, fill #0

## 2022-02-20 ENCOUNTER — Other Ambulatory Visit: Payer: Self-pay

## 2022-02-20 ENCOUNTER — Other Ambulatory Visit (HOSPITAL_COMMUNITY): Payer: Self-pay

## 2022-02-26 ENCOUNTER — Other Ambulatory Visit (HOSPITAL_COMMUNITY): Payer: Self-pay

## 2022-03-14 ENCOUNTER — Other Ambulatory Visit (HOSPITAL_COMMUNITY): Payer: Self-pay

## 2022-03-15 ENCOUNTER — Other Ambulatory Visit (HOSPITAL_COMMUNITY): Payer: Self-pay

## 2022-03-15 MED ORDER — LISDEXAMFETAMINE DIMESYLATE 20 MG PO CAPS
20.0000 mg | ORAL_CAPSULE | Freq: Every morning | ORAL | 0 refills | Status: AC
  Filled 2022-03-15: qty 30, 30d supply, fill #0

## 2022-03-20 ENCOUNTER — Other Ambulatory Visit: Payer: Self-pay

## 2022-03-21 ENCOUNTER — Other Ambulatory Visit (HOSPITAL_COMMUNITY): Payer: Self-pay

## 2022-03-22 ENCOUNTER — Other Ambulatory Visit (HOSPITAL_COMMUNITY): Payer: Self-pay

## 2022-04-22 ENCOUNTER — Other Ambulatory Visit (HOSPITAL_COMMUNITY): Payer: Self-pay

## 2022-04-22 MED ORDER — TELMISARTAN-HCTZ 80-25 MG PO TABS
1.0000 | ORAL_TABLET | Freq: Every day | ORAL | 0 refills | Status: AC
  Filled 2022-04-22: qty 30, 30d supply, fill #0
  Filled 2022-05-26: qty 30, 30d supply, fill #1
  Filled 2022-07-02: qty 30, 30d supply, fill #2

## 2022-05-29 ENCOUNTER — Other Ambulatory Visit (HOSPITAL_COMMUNITY): Payer: Self-pay

## 2022-05-29 MED ORDER — MOUNJARO 15 MG/0.5ML ~~LOC~~ SOAJ
15.0000 mg | SUBCUTANEOUS | 0 refills | Status: AC
  Filled 2022-07-04 – 2022-07-24 (×2): qty 2, 28d supply, fill #0
  Filled 2022-08-20: qty 2, 28d supply, fill #1
  Filled ????-??-??: fill #0

## 2022-05-29 MED ORDER — MOUNJARO 15 MG/0.5ML ~~LOC~~ SOAJ
15.0000 mg | SUBCUTANEOUS | 0 refills | Status: AC
  Filled 2022-05-29: qty 2, 28d supply, fill #0

## 2022-05-31 ENCOUNTER — Other Ambulatory Visit (HOSPITAL_COMMUNITY): Payer: Self-pay

## 2022-06-10 ENCOUNTER — Other Ambulatory Visit (HOSPITAL_COMMUNITY): Payer: Self-pay

## 2022-06-11 ENCOUNTER — Other Ambulatory Visit (HOSPITAL_COMMUNITY): Payer: Self-pay

## 2022-06-12 ENCOUNTER — Other Ambulatory Visit (HOSPITAL_COMMUNITY): Payer: Self-pay

## 2022-07-02 ENCOUNTER — Other Ambulatory Visit (HOSPITAL_COMMUNITY): Payer: Self-pay

## 2022-07-04 ENCOUNTER — Other Ambulatory Visit (HOSPITAL_COMMUNITY): Payer: Self-pay

## 2022-07-09 ENCOUNTER — Other Ambulatory Visit (HOSPITAL_COMMUNITY): Payer: Self-pay

## 2022-07-11 ENCOUNTER — Other Ambulatory Visit (HOSPITAL_COMMUNITY): Payer: Self-pay

## 2022-07-24 ENCOUNTER — Other Ambulatory Visit (HOSPITAL_COMMUNITY): Payer: Self-pay

## 2022-07-25 ENCOUNTER — Other Ambulatory Visit (HOSPITAL_COMMUNITY): Payer: Self-pay

## 2022-07-26 ENCOUNTER — Other Ambulatory Visit (HOSPITAL_COMMUNITY): Payer: Self-pay

## 2022-08-22 ENCOUNTER — Other Ambulatory Visit (HOSPITAL_COMMUNITY): Payer: Self-pay

## 2022-08-23 ENCOUNTER — Other Ambulatory Visit (HOSPITAL_COMMUNITY): Payer: Self-pay

## 2022-08-28 ENCOUNTER — Other Ambulatory Visit (HOSPITAL_COMMUNITY): Payer: Self-pay

## 2022-08-28 ENCOUNTER — Other Ambulatory Visit: Payer: Self-pay

## 2022-09-02 ENCOUNTER — Other Ambulatory Visit (HOSPITAL_COMMUNITY): Payer: Self-pay

## 2022-09-03 ENCOUNTER — Other Ambulatory Visit (HOSPITAL_COMMUNITY): Payer: Self-pay

## 2022-09-16 ENCOUNTER — Other Ambulatory Visit (HOSPITAL_COMMUNITY): Payer: Self-pay

## 2022-12-18 ENCOUNTER — Other Ambulatory Visit (HOSPITAL_COMMUNITY): Payer: Self-pay
